# Patient Record
Sex: Male | Born: 1985
Health system: Southern US, Community
[De-identification: ages and names within clinical notes are randomized; demographics above are authoritative.]

## PROBLEM LIST (undated history)

## (undated) DIAGNOSIS — F209 Schizophrenia, unspecified: Secondary | ICD-10-CM

## (undated) DIAGNOSIS — R011 Cardiac murmur, unspecified: Secondary | ICD-10-CM

## (undated) DIAGNOSIS — F419 Anxiety disorder, unspecified: Secondary | ICD-10-CM

## (undated) DIAGNOSIS — B070 Plantar wart: Secondary | ICD-10-CM

## (undated) DIAGNOSIS — K219 Gastro-esophageal reflux disease without esophagitis: Secondary | ICD-10-CM

## (undated) HISTORY — PX: PLANTAR'S WART EXCISION: SHX2240

## (undated) HISTORY — DX: Cardiac murmur, unspecified: R01.1

## (undated) HISTORY — PX: ESOPHAGOGASTRODUODENOSCOPY: SHX1529

---
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
## 2008-12-24 ENCOUNTER — Inpatient Hospital Stay: Payer: Self-pay | Admitting: Psychiatry

## 2009-08-05 ENCOUNTER — Emergency Department: Payer: Self-pay | Admitting: Emergency Medicine

## 2009-08-10 ENCOUNTER — Emergency Department: Payer: Self-pay | Admitting: Emergency Medicine

## 2009-10-27 ENCOUNTER — Emergency Department: Payer: Self-pay | Admitting: Emergency Medicine

## 2010-06-05 ENCOUNTER — Ambulatory Visit: Payer: Self-pay | Admitting: Emergency Medicine

## 2010-09-08 ENCOUNTER — Emergency Department: Payer: Self-pay | Admitting: Emergency Medicine

## 2010-10-26 ENCOUNTER — Ambulatory Visit: Payer: Self-pay | Admitting: Gastroenterology

## 2010-10-28 ENCOUNTER — Emergency Department: Payer: Self-pay | Admitting: Emergency Medicine

## 2011-03-20 ENCOUNTER — Emergency Department: Payer: Self-pay | Admitting: Emergency Medicine

## 2011-03-21 ENCOUNTER — Ambulatory Visit: Payer: Self-pay | Admitting: Internal Medicine

## 2011-04-15 DIAGNOSIS — S8000XA Contusion of unspecified knee, initial encounter: Secondary | ICD-10-CM | POA: Diagnosis not present

## 2011-04-28 ENCOUNTER — Emergency Department: Payer: Self-pay | Admitting: Emergency Medicine

## 2011-04-28 DIAGNOSIS — F17200 Nicotine dependence, unspecified, uncomplicated: Secondary | ICD-10-CM | POA: Diagnosis not present

## 2011-04-28 DIAGNOSIS — K089 Disorder of teeth and supporting structures, unspecified: Secondary | ICD-10-CM | POA: Diagnosis not present

## 2011-04-28 DIAGNOSIS — G8918 Other acute postprocedural pain: Secondary | ICD-10-CM | POA: Diagnosis not present

## 2011-06-17 DIAGNOSIS — J019 Acute sinusitis, unspecified: Secondary | ICD-10-CM | POA: Diagnosis not present

## 2011-07-05 ENCOUNTER — Ambulatory Visit: Payer: Self-pay | Admitting: Internal Medicine

## 2011-07-05 DIAGNOSIS — IMO0002 Reserved for concepts with insufficient information to code with codable children: Secondary | ICD-10-CM | POA: Diagnosis not present

## 2011-07-05 DIAGNOSIS — S99929A Unspecified injury of unspecified foot, initial encounter: Secondary | ICD-10-CM | POA: Diagnosis not present

## 2011-07-05 DIAGNOSIS — S99919A Unspecified injury of unspecified ankle, initial encounter: Secondary | ICD-10-CM | POA: Diagnosis not present

## 2011-07-05 DIAGNOSIS — K1370 Unspecified lesions of oral mucosa: Secondary | ICD-10-CM | POA: Diagnosis not present

## 2011-07-05 DIAGNOSIS — M25569 Pain in unspecified knee: Secondary | ICD-10-CM | POA: Diagnosis not present

## 2011-07-10 DIAGNOSIS — F259 Schizoaffective disorder, unspecified: Secondary | ICD-10-CM | POA: Diagnosis not present

## 2011-07-31 DIAGNOSIS — F259 Schizoaffective disorder, unspecified: Secondary | ICD-10-CM | POA: Diagnosis not present

## 2011-08-06 ENCOUNTER — Ambulatory Visit: Payer: Self-pay | Admitting: Internal Medicine

## 2011-08-06 DIAGNOSIS — L02818 Cutaneous abscess of other sites: Secondary | ICD-10-CM | POA: Diagnosis not present

## 2011-08-06 DIAGNOSIS — L03818 Cellulitis of other sites: Secondary | ICD-10-CM | POA: Diagnosis not present

## 2011-08-13 ENCOUNTER — Emergency Department: Payer: Self-pay | Admitting: *Deleted

## 2011-08-13 DIAGNOSIS — R112 Nausea with vomiting, unspecified: Secondary | ICD-10-CM | POA: Diagnosis not present

## 2011-08-13 DIAGNOSIS — I10 Essential (primary) hypertension: Secondary | ICD-10-CM | POA: Diagnosis not present

## 2011-08-13 DIAGNOSIS — F17200 Nicotine dependence, unspecified, uncomplicated: Secondary | ICD-10-CM | POA: Diagnosis not present

## 2011-08-13 DIAGNOSIS — K219 Gastro-esophageal reflux disease without esophagitis: Secondary | ICD-10-CM | POA: Diagnosis not present

## 2011-08-13 DIAGNOSIS — I059 Rheumatic mitral valve disease, unspecified: Secondary | ICD-10-CM | POA: Diagnosis not present

## 2011-08-13 DIAGNOSIS — Z79899 Other long term (current) drug therapy: Secondary | ICD-10-CM | POA: Diagnosis not present

## 2011-08-13 DIAGNOSIS — R079 Chest pain, unspecified: Secondary | ICD-10-CM | POA: Diagnosis not present

## 2011-08-13 DIAGNOSIS — R0789 Other chest pain: Secondary | ICD-10-CM | POA: Diagnosis not present

## 2011-08-13 DIAGNOSIS — R1110 Vomiting, unspecified: Secondary | ICD-10-CM | POA: Diagnosis not present

## 2011-08-13 DIAGNOSIS — K2970 Gastritis, unspecified, without bleeding: Secondary | ICD-10-CM | POA: Diagnosis not present

## 2011-08-13 DIAGNOSIS — F319 Bipolar disorder, unspecified: Secondary | ICD-10-CM | POA: Diagnosis not present

## 2011-08-13 LAB — COMPREHENSIVE METABOLIC PANEL
Alkaline Phosphatase: 109 U/L (ref 50–136)
Anion Gap: 8 (ref 7–16)
Bilirubin,Total: 0.6 mg/dL (ref 0.2–1.0)
Calcium, Total: 9.5 mg/dL (ref 8.5–10.1)
Chloride: 103 mmol/L (ref 98–107)
Co2: 26 mmol/L (ref 21–32)
Creatinine: 1.14 mg/dL (ref 0.60–1.30)
Glucose: 68 mg/dL (ref 65–99)
Osmolality: 272 (ref 275–301)
Potassium: 4.6 mmol/L (ref 3.5–5.1)
SGPT (ALT): 34 U/L

## 2011-08-13 LAB — CBC
HCT: 50.8 % (ref 40.0–52.0)
MCH: 29.2 pg (ref 26.0–34.0)
MCHC: 32.8 g/dL (ref 32.0–36.0)
MCV: 89 fL (ref 80–100)
Platelet: 219 10*3/uL (ref 150–440)
RBC: 5.7 10*6/uL (ref 4.40–5.90)
RDW: 13.3 % (ref 11.5–14.5)
WBC: 5.3 10*3/uL (ref 3.8–10.6)

## 2011-08-13 LAB — URINALYSIS, COMPLETE
Bilirubin,UR: NEGATIVE
Glucose,UR: NEGATIVE mg/dL (ref 0–75)
Ph: 6 (ref 4.5–8.0)
RBC,UR: NONE SEEN /HPF (ref 0–5)
Squamous Epithelial: NONE SEEN

## 2011-08-13 LAB — CK TOTAL AND CKMB (NOT AT ARMC)
CK, Total: 195 U/L (ref 35–232)
CK-MB: 0.9 ng/mL (ref 0.5–3.6)

## 2011-08-14 DIAGNOSIS — I059 Rheumatic mitral valve disease, unspecified: Secondary | ICD-10-CM | POA: Diagnosis not present

## 2011-08-14 DIAGNOSIS — K277 Chronic peptic ulcer, site unspecified, without hemorrhage or perforation: Secondary | ICD-10-CM | POA: Diagnosis not present

## 2011-08-14 DIAGNOSIS — I10 Essential (primary) hypertension: Secondary | ICD-10-CM | POA: Diagnosis not present

## 2011-08-14 DIAGNOSIS — R112 Nausea with vomiting, unspecified: Secondary | ICD-10-CM | POA: Diagnosis not present

## 2011-08-16 ENCOUNTER — Ambulatory Visit: Payer: Self-pay | Admitting: Internal Medicine

## 2011-08-16 DIAGNOSIS — I41 Myocarditis in diseases classified elsewhere: Secondary | ICD-10-CM | POA: Diagnosis not present

## 2011-08-16 DIAGNOSIS — I409 Acute myocarditis, unspecified: Secondary | ICD-10-CM | POA: Diagnosis not present

## 2011-08-27 ENCOUNTER — Emergency Department: Payer: Self-pay | Admitting: Emergency Medicine

## 2011-08-27 DIAGNOSIS — Z79899 Other long term (current) drug therapy: Secondary | ICD-10-CM | POA: Diagnosis not present

## 2011-08-27 DIAGNOSIS — F411 Generalized anxiety disorder: Secondary | ICD-10-CM | POA: Diagnosis not present

## 2011-08-27 DIAGNOSIS — F17200 Nicotine dependence, unspecified, uncomplicated: Secondary | ICD-10-CM | POA: Diagnosis not present

## 2011-08-27 DIAGNOSIS — R209 Unspecified disturbances of skin sensation: Secondary | ICD-10-CM | POA: Diagnosis not present

## 2011-08-29 ENCOUNTER — Emergency Department: Payer: Self-pay | Admitting: Emergency Medicine

## 2011-08-29 DIAGNOSIS — I10 Essential (primary) hypertension: Secondary | ICD-10-CM | POA: Diagnosis present

## 2011-08-29 DIAGNOSIS — F200 Paranoid schizophrenia: Secondary | ICD-10-CM | POA: Diagnosis present

## 2011-08-29 DIAGNOSIS — Z79899 Other long term (current) drug therapy: Secondary | ICD-10-CM | POA: Diagnosis not present

## 2011-08-29 DIAGNOSIS — K219 Gastro-esophageal reflux disease without esophagitis: Secondary | ICD-10-CM | POA: Diagnosis present

## 2011-08-29 DIAGNOSIS — F209 Schizophrenia, unspecified: Secondary | ICD-10-CM | POA: Diagnosis not present

## 2011-08-29 DIAGNOSIS — F17200 Nicotine dependence, unspecified, uncomplicated: Secondary | ICD-10-CM | POA: Diagnosis not present

## 2011-08-29 DIAGNOSIS — F29 Unspecified psychosis not due to a substance or known physiological condition: Secondary | ICD-10-CM | POA: Diagnosis not present

## 2011-08-29 LAB — COMPREHENSIVE METABOLIC PANEL
Albumin: 4.1 g/dL (ref 3.4–5.0)
Anion Gap: 11 (ref 7–16)
Calcium, Total: 9.2 mg/dL (ref 8.5–10.1)
Chloride: 102 mmol/L (ref 98–107)
Creatinine: 1.12 mg/dL (ref 0.60–1.30)
EGFR (Non-African Amer.): >60
Glucose: 90 mg/dL (ref 65–99)
SGOT(AST): 58 U/L — ABNORMAL HIGH (ref 15–37)
SGPT (ALT): 37 U/L
Total Protein: 7.9 g/dL (ref 6.4–8.2)

## 2011-08-29 LAB — TSH: Thyroid Stimulating Horm: 0.53 u[IU]/mL

## 2011-08-29 LAB — URINALYSIS, COMPLETE
Bilirubin,UR: NEGATIVE
Leukocyte Esterase: NEGATIVE
Protein: 30
RBC,UR: 16 /HPF (ref 0–5)
Specific Gravity: 1.029 (ref 1.003–1.030)
WBC UR: 1 /HPF (ref 0–5)

## 2011-08-29 LAB — ETHANOL
Ethanol %: <0.003 % (ref 0.000–0.080)
Ethanol: <3 mg/dL

## 2011-08-29 LAB — CBC
HCT: 46.3 % (ref 40.0–52.0)
MCHC: 33.4 g/dL (ref 32.0–36.0)
MCV: 88 fL (ref 80–100)
RBC: 5.28 10*6/uL (ref 4.40–5.90)

## 2011-08-29 LAB — DRUG SCREEN, URINE
Benzodiazepine, Ur Scrn: NEGATIVE (ref ?–200)
Cocaine Metabolite,Ur ~~LOC~~: NEGATIVE (ref ?–300)
MDMA (Ecstasy)Ur Screen: NEGATIVE (ref ?–500)
Methadone, Ur Screen: NEGATIVE (ref ?–300)
Opiate, Ur Screen: NEGATIVE (ref ?–300)
Phencyclidine (PCP) Ur S: NEGATIVE (ref ?–25)
Tricyclic, Ur Screen: NEGATIVE (ref ?–1000)

## 2011-09-10 DIAGNOSIS — F259 Schizoaffective disorder, unspecified: Secondary | ICD-10-CM | POA: Diagnosis not present

## 2011-09-13 DIAGNOSIS — F200 Paranoid schizophrenia: Secondary | ICD-10-CM | POA: Diagnosis not present

## 2011-09-13 DIAGNOSIS — B009 Herpesviral infection, unspecified: Secondary | ICD-10-CM | POA: Diagnosis not present

## 2011-09-13 DIAGNOSIS — I10 Essential (primary) hypertension: Secondary | ICD-10-CM | POA: Diagnosis not present

## 2011-11-02 DIAGNOSIS — F259 Schizoaffective disorder, unspecified: Secondary | ICD-10-CM | POA: Diagnosis not present

## 2011-12-03 DIAGNOSIS — R1115 Cyclical vomiting syndrome unrelated to migraine: Secondary | ICD-10-CM | POA: Diagnosis not present

## 2011-12-03 DIAGNOSIS — R5381 Other malaise: Secondary | ICD-10-CM | POA: Diagnosis not present

## 2011-12-17 DIAGNOSIS — Z13220 Encounter for screening for lipoid disorders: Secondary | ICD-10-CM | POA: Diagnosis not present

## 2011-12-17 DIAGNOSIS — Z23 Encounter for immunization: Secondary | ICD-10-CM | POA: Diagnosis not present

## 2011-12-17 DIAGNOSIS — R1115 Cyclical vomiting syndrome unrelated to migraine: Secondary | ICD-10-CM | POA: Diagnosis not present

## 2011-12-17 DIAGNOSIS — R109 Unspecified abdominal pain: Secondary | ICD-10-CM | POA: Diagnosis not present

## 2011-12-25 DIAGNOSIS — F259 Schizoaffective disorder, unspecified: Secondary | ICD-10-CM | POA: Diagnosis not present

## 2012-01-08 DIAGNOSIS — F259 Schizoaffective disorder, unspecified: Secondary | ICD-10-CM | POA: Diagnosis not present

## 2012-01-15 DIAGNOSIS — F259 Schizoaffective disorder, unspecified: Secondary | ICD-10-CM | POA: Diagnosis not present

## 2012-02-03 DIAGNOSIS — J069 Acute upper respiratory infection, unspecified: Secondary | ICD-10-CM | POA: Diagnosis not present

## 2012-02-03 DIAGNOSIS — K219 Gastro-esophageal reflux disease without esophagitis: Secondary | ICD-10-CM | POA: Diagnosis not present

## 2012-02-10 DIAGNOSIS — F259 Schizoaffective disorder, unspecified: Secondary | ICD-10-CM | POA: Diagnosis not present

## 2012-02-13 ENCOUNTER — Ambulatory Visit: Payer: Self-pay | Admitting: Family Medicine

## 2012-02-13 DIAGNOSIS — J069 Acute upper respiratory infection, unspecified: Secondary | ICD-10-CM | POA: Diagnosis not present

## 2012-02-13 DIAGNOSIS — F17200 Nicotine dependence, unspecified, uncomplicated: Secondary | ICD-10-CM | POA: Diagnosis not present

## 2012-02-13 DIAGNOSIS — R05 Cough: Secondary | ICD-10-CM | POA: Diagnosis not present

## 2012-02-13 DIAGNOSIS — R079 Chest pain, unspecified: Secondary | ICD-10-CM | POA: Diagnosis not present

## 2012-03-19 ENCOUNTER — Emergency Department: Payer: Self-pay | Admitting: Emergency Medicine

## 2012-03-19 DIAGNOSIS — R1013 Epigastric pain: Secondary | ICD-10-CM | POA: Diagnosis not present

## 2012-03-19 DIAGNOSIS — R079 Chest pain, unspecified: Secondary | ICD-10-CM | POA: Diagnosis not present

## 2012-03-19 DIAGNOSIS — K219 Gastro-esophageal reflux disease without esophagitis: Secondary | ICD-10-CM | POA: Diagnosis not present

## 2012-03-19 DIAGNOSIS — R0789 Other chest pain: Secondary | ICD-10-CM | POA: Diagnosis not present

## 2012-03-19 DIAGNOSIS — Z79899 Other long term (current) drug therapy: Secondary | ICD-10-CM | POA: Diagnosis not present

## 2012-03-19 DIAGNOSIS — R071 Chest pain on breathing: Secondary | ICD-10-CM | POA: Diagnosis not present

## 2012-03-19 DIAGNOSIS — R112 Nausea with vomiting, unspecified: Secondary | ICD-10-CM | POA: Diagnosis not present

## 2012-03-19 LAB — CBC
HCT: 49.1 % (ref 40.0–52.0)
HGB: 15.8 g/dL (ref 13.0–18.0)
MCH: 28.8 pg (ref 26.0–34.0)
MCV: 90 fL (ref 80–100)
RBC: 5.48 10*6/uL (ref 4.40–5.90)
RDW: 13.4 % (ref 11.5–14.5)
WBC: 7.2 10*3/uL (ref 3.8–10.6)

## 2012-03-19 LAB — COMPREHENSIVE METABOLIC PANEL
Albumin: 3.7 g/dL (ref 3.4–5.0)
Alkaline Phosphatase: 109 U/L (ref 50–136)
Bilirubin,Total: 0.2 mg/dL (ref 0.2–1.0)
Calcium, Total: 9 mg/dL (ref 8.5–10.1)
Co2: 27 mmol/L (ref 21–32)
Creatinine: 1.08 mg/dL (ref 0.60–1.30)
EGFR (African American): >60
EGFR (Non-African Amer.): >60
Glucose: 110 mg/dL — ABNORMAL HIGH (ref 65–99)
Osmolality: 285 (ref 275–301)
Potassium: 3.7 mmol/L (ref 3.5–5.1)
SGPT (ALT): 34 U/L (ref 12–78)
Sodium: 142 mmol/L (ref 136–145)

## 2012-03-19 LAB — LIPASE, BLOOD: Lipase: 130 U/L (ref 73–393)

## 2012-03-19 LAB — TROPONIN I: Troponin-I: <0.02 ng/mL

## 2012-03-26 DIAGNOSIS — F259 Schizoaffective disorder, unspecified: Secondary | ICD-10-CM | POA: Diagnosis not present

## 2012-04-23 DIAGNOSIS — N410 Acute prostatitis: Secondary | ICD-10-CM | POA: Diagnosis not present

## 2012-04-23 DIAGNOSIS — J309 Allergic rhinitis, unspecified: Secondary | ICD-10-CM | POA: Diagnosis not present

## 2012-04-23 DIAGNOSIS — R109 Unspecified abdominal pain: Secondary | ICD-10-CM | POA: Diagnosis not present

## 2012-04-27 ENCOUNTER — Ambulatory Visit: Payer: Self-pay | Admitting: Family Medicine

## 2012-04-27 DIAGNOSIS — Q619 Cystic kidney disease, unspecified: Secondary | ICD-10-CM | POA: Diagnosis not present

## 2012-04-27 DIAGNOSIS — R1013 Epigastric pain: Secondary | ICD-10-CM | POA: Diagnosis not present

## 2012-04-27 DIAGNOSIS — R1110 Vomiting, unspecified: Secondary | ICD-10-CM | POA: Diagnosis not present

## 2012-04-29 DIAGNOSIS — J309 Allergic rhinitis, unspecified: Secondary | ICD-10-CM | POA: Diagnosis not present

## 2012-04-29 DIAGNOSIS — R1084 Generalized abdominal pain: Secondary | ICD-10-CM | POA: Diagnosis not present

## 2012-05-09 DIAGNOSIS — Z79899 Other long term (current) drug therapy: Secondary | ICD-10-CM | POA: Diagnosis not present

## 2012-05-09 DIAGNOSIS — F259 Schizoaffective disorder, unspecified: Secondary | ICD-10-CM | POA: Diagnosis not present

## 2012-06-15 DIAGNOSIS — R1110 Vomiting, unspecified: Secondary | ICD-10-CM | POA: Diagnosis not present

## 2012-06-19 ENCOUNTER — Ambulatory Visit: Payer: Self-pay | Admitting: Gastroenterology

## 2012-06-19 DIAGNOSIS — R1110 Vomiting, unspecified: Secondary | ICD-10-CM | POA: Diagnosis not present

## 2012-07-10 ENCOUNTER — Ambulatory Visit: Payer: Self-pay | Admitting: Family Medicine

## 2012-07-10 DIAGNOSIS — R1110 Vomiting, unspecified: Secondary | ICD-10-CM | POA: Diagnosis not present

## 2012-07-10 LAB — COMPREHENSIVE METABOLIC PANEL
Albumin: 3.9 g/dL (ref 3.4–5.0)
Alkaline Phosphatase: 118 U/L (ref 50–136)
Anion Gap: 4 — ABNORMAL LOW (ref 7–16)
Calcium, Total: 9.5 mg/dL (ref 8.5–10.1)
EGFR (African American): >60
EGFR (Non-African Amer.): >60
Glucose: 81 mg/dL (ref 65–99)
Osmolality: 277 (ref 275–301)
Potassium: 3.9 mmol/L (ref 3.5–5.1)
SGOT(AST): 22 U/L (ref 15–37)
SGPT (ALT): 29 U/L (ref 12–78)
Sodium: 140 mmol/L (ref 136–145)
Total Protein: 7.6 g/dL (ref 6.4–8.2)

## 2012-07-10 LAB — CBC WITH DIFFERENTIAL/PLATELET
Basophil #: 0.1 10*3/uL (ref 0.0–0.1)
Basophil %: 0.9 %
Eosinophil #: 0.1 10*3/uL (ref 0.0–0.7)
HCT: 51.1 % (ref 40.0–52.0)
Lymphocyte #: 2 10*3/uL (ref 1.0–3.6)
MCHC: 32.2 g/dL (ref 32.0–36.0)
Monocyte #: 0.5 x10 3/mm (ref 0.2–1.0)
Monocyte %: 8.5 %
Neutrophil %: 55.1 %
RBC: 5.81 10*6/uL (ref 4.40–5.90)
RDW: 13.1 % (ref 11.5–14.5)
WBC: 6.1 10*3/uL (ref 3.8–10.6)

## 2012-10-09 DIAGNOSIS — F259 Schizoaffective disorder, unspecified: Secondary | ICD-10-CM | POA: Diagnosis not present

## 2012-10-15 DIAGNOSIS — F259 Schizoaffective disorder, unspecified: Secondary | ICD-10-CM | POA: Diagnosis not present

## 2012-10-19 ENCOUNTER — Emergency Department: Payer: Self-pay | Admitting: Emergency Medicine

## 2012-10-19 DIAGNOSIS — R1013 Epigastric pain: Secondary | ICD-10-CM | POA: Diagnosis not present

## 2012-10-19 DIAGNOSIS — R109 Unspecified abdominal pain: Secondary | ICD-10-CM | POA: Diagnosis not present

## 2012-10-19 DIAGNOSIS — I10 Essential (primary) hypertension: Secondary | ICD-10-CM | POA: Diagnosis not present

## 2012-10-19 DIAGNOSIS — R1110 Vomiting, unspecified: Secondary | ICD-10-CM | POA: Diagnosis not present

## 2012-11-16 ENCOUNTER — Other Ambulatory Visit: Payer: Self-pay | Admitting: Family

## 2012-11-16 DIAGNOSIS — R1310 Dysphagia, unspecified: Secondary | ICD-10-CM | POA: Diagnosis not present

## 2012-11-16 DIAGNOSIS — R112 Nausea with vomiting, unspecified: Secondary | ICD-10-CM | POA: Diagnosis not present

## 2012-11-16 DIAGNOSIS — R1084 Generalized abdominal pain: Secondary | ICD-10-CM | POA: Diagnosis not present

## 2012-11-16 DIAGNOSIS — R634 Abnormal weight loss: Secondary | ICD-10-CM | POA: Diagnosis not present

## 2012-11-16 LAB — COMPREHENSIVE METABOLIC PANEL
Albumin: 3.7 g/dL (ref 3.4–5.0)
Alkaline Phosphatase: 117 U/L (ref 50–136)
BUN: 11 mg/dL (ref 7–18)
Calcium, Total: 9.3 mg/dL (ref 8.5–10.1)
Chloride: 105 mmol/L (ref 98–107)
Creatinine: 1.19 mg/dL (ref 0.60–1.30)
EGFR (African American): >60
Osmolality: 272 (ref 275–301)
Potassium: 4.3 mmol/L (ref 3.5–5.1)
SGPT (ALT): 24 U/L (ref 12–78)
Sodium: 137 mmol/L (ref 136–145)
Total Protein: 7.4 g/dL (ref 6.4–8.2)

## 2012-11-16 LAB — CBC WITH DIFFERENTIAL/PLATELET
Basophil %: 0.8 %
Eosinophil #: 0.5 10*3/uL (ref 0.0–0.7)
Eosinophil %: 7.1 %
HCT: 47.7 % (ref 40.0–52.0)
Lymphocyte #: 2.6 10*3/uL (ref 1.0–3.6)
Lymphocyte %: 36.4 %
MCHC: 33.7 g/dL (ref 32.0–36.0)
Monocyte #: 0.5 x10 3/mm (ref 0.2–1.0)
Monocyte %: 6.3 %
Neutrophil #: 3.6 10*3/uL (ref 1.4–6.5)
Neutrophil %: 49.4 %
Platelet: 211 10*3/uL (ref 150–440)
RBC: 5.44 10*6/uL (ref 4.40–5.90)
RDW: 13.3 % (ref 11.5–14.5)
WBC: 7.2 10*3/uL (ref 3.8–10.6)

## 2012-11-16 LAB — LIPASE, BLOOD: Lipase: 100 U/L (ref 73–393)

## 2012-11-16 LAB — AMYLASE: Amylase: 57 U/L (ref 25–115)

## 2012-11-26 DIAGNOSIS — N289 Disorder of kidney and ureter, unspecified: Secondary | ICD-10-CM | POA: Diagnosis not present

## 2012-11-26 DIAGNOSIS — R112 Nausea with vomiting, unspecified: Secondary | ICD-10-CM | POA: Diagnosis not present

## 2012-11-26 DIAGNOSIS — R109 Unspecified abdominal pain: Secondary | ICD-10-CM | POA: Diagnosis not present

## 2012-11-26 DIAGNOSIS — R52 Pain, unspecified: Secondary | ICD-10-CM | POA: Diagnosis not present

## 2012-11-26 DIAGNOSIS — R634 Abnormal weight loss: Secondary | ICD-10-CM | POA: Diagnosis not present

## 2012-12-04 DIAGNOSIS — F259 Schizoaffective disorder, unspecified: Secondary | ICD-10-CM | POA: Diagnosis not present

## 2012-12-16 ENCOUNTER — Ambulatory Visit: Payer: Self-pay | Admitting: Gastroenterology

## 2012-12-16 DIAGNOSIS — Z809 Family history of malignant neoplasm, unspecified: Secondary | ICD-10-CM | POA: Diagnosis not present

## 2012-12-16 DIAGNOSIS — Z8371 Family history of colonic polyps: Secondary | ICD-10-CM | POA: Diagnosis not present

## 2012-12-16 DIAGNOSIS — R1310 Dysphagia, unspecified: Secondary | ICD-10-CM | POA: Diagnosis not present

## 2012-12-16 DIAGNOSIS — R12 Heartburn: Secondary | ICD-10-CM | POA: Diagnosis not present

## 2012-12-16 DIAGNOSIS — Z800 Family history of malignant neoplasm of digestive organs: Secondary | ICD-10-CM | POA: Diagnosis not present

## 2012-12-16 DIAGNOSIS — Z79899 Other long term (current) drug therapy: Secondary | ICD-10-CM | POA: Diagnosis not present

## 2012-12-16 DIAGNOSIS — Z803 Family history of malignant neoplasm of breast: Secondary | ICD-10-CM | POA: Diagnosis not present

## 2012-12-16 DIAGNOSIS — J309 Allergic rhinitis, unspecified: Secondary | ICD-10-CM | POA: Diagnosis not present

## 2012-12-16 DIAGNOSIS — F319 Bipolar disorder, unspecified: Secondary | ICD-10-CM | POA: Diagnosis not present

## 2012-12-16 DIAGNOSIS — I517 Cardiomegaly: Secondary | ICD-10-CM | POA: Diagnosis not present

## 2012-12-21 LAB — PATHOLOGY REPORT

## 2013-01-04 DIAGNOSIS — Z23 Encounter for immunization: Secondary | ICD-10-CM | POA: Diagnosis not present

## 2013-01-04 DIAGNOSIS — N529 Male erectile dysfunction, unspecified: Secondary | ICD-10-CM | POA: Diagnosis not present

## 2013-02-03 DIAGNOSIS — F259 Schizoaffective disorder, unspecified: Secondary | ICD-10-CM | POA: Diagnosis not present

## 2013-02-26 DIAGNOSIS — K920 Hematemesis: Secondary | ICD-10-CM | POA: Diagnosis not present

## 2013-03-01 ENCOUNTER — Ambulatory Visit: Payer: Self-pay | Admitting: Gastroenterology

## 2013-03-01 DIAGNOSIS — F319 Bipolar disorder, unspecified: Secondary | ICD-10-CM | POA: Diagnosis not present

## 2013-03-01 DIAGNOSIS — R042 Hemoptysis: Secondary | ICD-10-CM | POA: Diagnosis not present

## 2013-03-01 DIAGNOSIS — Z803 Family history of malignant neoplasm of breast: Secondary | ICD-10-CM | POA: Diagnosis not present

## 2013-03-01 DIAGNOSIS — F17200 Nicotine dependence, unspecified, uncomplicated: Secondary | ICD-10-CM | POA: Diagnosis not present

## 2013-03-01 DIAGNOSIS — K920 Hematemesis: Secondary | ICD-10-CM | POA: Diagnosis not present

## 2013-03-01 DIAGNOSIS — J309 Allergic rhinitis, unspecified: Secondary | ICD-10-CM | POA: Diagnosis not present

## 2013-03-16 DIAGNOSIS — K915 Postcholecystectomy syndrome: Secondary | ICD-10-CM | POA: Diagnosis not present

## 2013-03-16 DIAGNOSIS — R112 Nausea with vomiting, unspecified: Secondary | ICD-10-CM | POA: Diagnosis not present

## 2013-03-16 DIAGNOSIS — R1310 Dysphagia, unspecified: Secondary | ICD-10-CM | POA: Diagnosis not present

## 2013-06-03 DIAGNOSIS — B86 Scabies: Secondary | ICD-10-CM | POA: Diagnosis not present

## 2013-06-29 DIAGNOSIS — F410 Panic disorder [episodic paroxysmal anxiety] without agoraphobia: Secondary | ICD-10-CM | POA: Diagnosis not present

## 2013-06-29 DIAGNOSIS — G4700 Insomnia, unspecified: Secondary | ICD-10-CM | POA: Diagnosis not present

## 2013-07-08 DIAGNOSIS — N529 Male erectile dysfunction, unspecified: Secondary | ICD-10-CM | POA: Diagnosis not present

## 2013-07-08 DIAGNOSIS — K219 Gastro-esophageal reflux disease without esophagitis: Secondary | ICD-10-CM | POA: Diagnosis not present

## 2013-07-09 DIAGNOSIS — R5383 Other fatigue: Secondary | ICD-10-CM | POA: Diagnosis not present

## 2013-07-09 DIAGNOSIS — N529 Male erectile dysfunction, unspecified: Secondary | ICD-10-CM | POA: Diagnosis not present

## 2013-07-09 DIAGNOSIS — R5381 Other malaise: Secondary | ICD-10-CM | POA: Diagnosis not present

## 2013-07-09 DIAGNOSIS — K219 Gastro-esophageal reflux disease without esophagitis: Secondary | ICD-10-CM | POA: Diagnosis not present

## 2013-07-09 DIAGNOSIS — Z125 Encounter for screening for malignant neoplasm of prostate: Secondary | ICD-10-CM | POA: Diagnosis not present

## 2013-07-25 ENCOUNTER — Ambulatory Visit: Payer: Self-pay | Admitting: Family Medicine

## 2013-07-25 DIAGNOSIS — S298XXA Other specified injuries of thorax, initial encounter: Secondary | ICD-10-CM | POA: Diagnosis not present

## 2013-07-25 DIAGNOSIS — S0003XA Contusion of scalp, initial encounter: Secondary | ICD-10-CM | POA: Diagnosis not present

## 2013-07-25 DIAGNOSIS — F17200 Nicotine dependence, unspecified, uncomplicated: Secondary | ICD-10-CM | POA: Diagnosis not present

## 2013-07-25 DIAGNOSIS — S3981XA Other specified injuries of abdomen, initial encounter: Secondary | ICD-10-CM | POA: Diagnosis not present

## 2013-07-25 DIAGNOSIS — I428 Other cardiomyopathies: Secondary | ICD-10-CM | POA: Diagnosis not present

## 2013-07-25 DIAGNOSIS — S0990XA Unspecified injury of head, initial encounter: Secondary | ICD-10-CM | POA: Diagnosis not present

## 2013-07-25 DIAGNOSIS — F410 Panic disorder [episodic paroxysmal anxiety] without agoraphobia: Secondary | ICD-10-CM | POA: Diagnosis not present

## 2013-07-25 DIAGNOSIS — I10 Essential (primary) hypertension: Secondary | ICD-10-CM | POA: Diagnosis not present

## 2013-07-25 DIAGNOSIS — K219 Gastro-esophageal reflux disease without esophagitis: Secondary | ICD-10-CM | POA: Diagnosis not present

## 2013-07-25 DIAGNOSIS — S1093XA Contusion of unspecified part of neck, initial encounter: Secondary | ICD-10-CM | POA: Diagnosis not present

## 2013-07-25 DIAGNOSIS — R51 Headache: Secondary | ICD-10-CM | POA: Diagnosis not present

## 2013-07-25 DIAGNOSIS — Z79899 Other long term (current) drug therapy: Secondary | ICD-10-CM | POA: Diagnosis not present

## 2013-07-25 DIAGNOSIS — R079 Chest pain, unspecified: Secondary | ICD-10-CM | POA: Diagnosis not present

## 2013-08-25 ENCOUNTER — Emergency Department: Payer: Self-pay | Admitting: Emergency Medicine

## 2013-08-25 DIAGNOSIS — I10 Essential (primary) hypertension: Secondary | ICD-10-CM | POA: Diagnosis not present

## 2013-08-25 DIAGNOSIS — K645 Perianal venous thrombosis: Secondary | ICD-10-CM | POA: Diagnosis not present

## 2013-08-25 DIAGNOSIS — F17200 Nicotine dependence, unspecified, uncomplicated: Secondary | ICD-10-CM | POA: Diagnosis not present

## 2013-08-25 LAB — CBC WITH DIFFERENTIAL/PLATELET
BASOS PCT: 0.7 %
Basophil #: 0.1 10*3/uL (ref 0.0–0.1)
EOS PCT: 3.8 %
Eosinophil #: 0.3 10*3/uL (ref 0.0–0.7)
HCT: 49 % (ref 40.0–52.0)
HGB: 15.9 g/dL (ref 13.0–18.0)
Lymphocyte #: 2.9 10*3/uL (ref 1.0–3.6)
Lymphocyte %: 40 %
MCH: 29.4 pg (ref 26.0–34.0)
MCHC: 32.4 g/dL (ref 32.0–36.0)
MCV: 91 fL (ref 80–100)
MONO ABS: 0.6 x10 3/mm (ref 0.2–1.0)
Monocyte %: 8 %
NEUTROS ABS: 3.5 10*3/uL (ref 1.4–6.5)
NEUTROS PCT: 47.5 %
Platelet: 177 10*3/uL (ref 150–440)
RBC: 5.39 10*6/uL (ref 4.40–5.90)
RDW: 13.6 % (ref 11.5–14.5)
WBC: 7.4 10*3/uL (ref 3.8–10.6)

## 2013-08-25 LAB — COMPREHENSIVE METABOLIC PANEL
ALBUMIN: 3.6 g/dL (ref 3.4–5.0)
Alkaline Phosphatase: 93 U/L
Anion Gap: 5 — ABNORMAL LOW (ref 7–16)
BUN: 10 mg/dL (ref 7–18)
Bilirubin,Total: 0.2 mg/dL (ref 0.2–1.0)
CHLORIDE: 108 mmol/L — AB (ref 98–107)
CO2: 27 mmol/L (ref 21–32)
CREATININE: 1.08 mg/dL (ref 0.60–1.30)
Calcium, Total: 9 mg/dL (ref 8.5–10.1)
EGFR (African American): >60
EGFR (Non-African Amer.): >60
GLUCOSE: 79 mg/dL (ref 65–99)
OSMOLALITY: 277 (ref 275–301)
Potassium: 4 mmol/L (ref 3.5–5.1)
SGOT(AST): 24 U/L (ref 15–37)
SGPT (ALT): 22 U/L (ref 12–78)
SODIUM: 140 mmol/L (ref 136–145)
Total Protein: 7.1 g/dL (ref 6.4–8.2)

## 2013-08-25 LAB — URINALYSIS, COMPLETE
BACTERIA: NONE SEEN
BILIRUBIN, UR: NEGATIVE
Blood: NEGATIVE
Glucose,UR: NEGATIVE mg/dL (ref 0–75)
Ketone: NEGATIVE
LEUKOCYTE ESTERASE: NEGATIVE
Nitrite: NEGATIVE
PROTEIN: NEGATIVE
Ph: 6 (ref 4.5–8.0)
RBC,UR: NONE SEEN /HPF (ref 0–5)
Specific Gravity: 1.02 (ref 1.003–1.030)
Squamous Epithelial: NONE SEEN
WBC UR: <1 /HPF (ref 0–5)

## 2013-08-25 LAB — LIPASE, BLOOD: LIPASE: 125 U/L (ref 73–393)

## 2013-08-27 DIAGNOSIS — K649 Unspecified hemorrhoids: Secondary | ICD-10-CM | POA: Diagnosis not present

## 2013-09-09 ENCOUNTER — Ambulatory Visit: Payer: Self-pay

## 2013-09-09 DIAGNOSIS — I428 Other cardiomyopathies: Secondary | ICD-10-CM | POA: Diagnosis not present

## 2013-09-09 DIAGNOSIS — K219 Gastro-esophageal reflux disease without esophagitis: Secondary | ICD-10-CM | POA: Diagnosis not present

## 2013-09-09 DIAGNOSIS — F209 Schizophrenia, unspecified: Secondary | ICD-10-CM | POA: Diagnosis not present

## 2013-09-09 DIAGNOSIS — K6289 Other specified diseases of anus and rectum: Secondary | ICD-10-CM | POA: Diagnosis not present

## 2013-09-09 DIAGNOSIS — F17200 Nicotine dependence, unspecified, uncomplicated: Secondary | ICD-10-CM | POA: Diagnosis not present

## 2013-09-09 DIAGNOSIS — I10 Essential (primary) hypertension: Secondary | ICD-10-CM | POA: Diagnosis not present

## 2013-09-09 DIAGNOSIS — Z79899 Other long term (current) drug therapy: Secondary | ICD-10-CM | POA: Diagnosis not present

## 2013-09-09 DIAGNOSIS — K594 Anal spasm: Secondary | ICD-10-CM | POA: Diagnosis not present

## 2013-09-09 LAB — OCCULT BLOOD X 1 CARD TO LAB, STOOL: OCCULT BLOOD, FECES: NEGATIVE

## 2013-10-06 ENCOUNTER — Emergency Department: Payer: Self-pay | Admitting: Emergency Medicine

## 2013-10-06 DIAGNOSIS — R1012 Left upper quadrant pain: Secondary | ICD-10-CM | POA: Diagnosis not present

## 2013-10-06 DIAGNOSIS — S0993XA Unspecified injury of face, initial encounter: Secondary | ICD-10-CM | POA: Diagnosis not present

## 2013-10-06 DIAGNOSIS — R51 Headache: Secondary | ICD-10-CM | POA: Diagnosis not present

## 2013-10-06 DIAGNOSIS — S199XXA Unspecified injury of neck, initial encounter: Secondary | ICD-10-CM | POA: Diagnosis not present

## 2013-10-06 DIAGNOSIS — S0990XA Unspecified injury of head, initial encounter: Secondary | ICD-10-CM | POA: Diagnosis not present

## 2013-10-06 DIAGNOSIS — IMO0001 Reserved for inherently not codable concepts without codable children: Secondary | ICD-10-CM | POA: Diagnosis not present

## 2013-10-06 DIAGNOSIS — R079 Chest pain, unspecified: Secondary | ICD-10-CM | POA: Diagnosis not present

## 2013-10-06 DIAGNOSIS — M542 Cervicalgia: Secondary | ICD-10-CM | POA: Diagnosis not present

## 2013-10-06 DIAGNOSIS — IMO0002 Reserved for concepts with insufficient information to code with codable children: Secondary | ICD-10-CM | POA: Diagnosis not present

## 2013-10-06 DIAGNOSIS — S3981XA Other specified injuries of abdomen, initial encounter: Secondary | ICD-10-CM | POA: Diagnosis not present

## 2013-10-06 DIAGNOSIS — T1490XA Injury, unspecified, initial encounter: Secondary | ICD-10-CM | POA: Diagnosis not present

## 2013-10-06 DIAGNOSIS — S298XXA Other specified injuries of thorax, initial encounter: Secondary | ICD-10-CM | POA: Diagnosis not present

## 2013-10-07 DIAGNOSIS — R079 Chest pain, unspecified: Secondary | ICD-10-CM | POA: Diagnosis not present

## 2013-10-07 DIAGNOSIS — S3981XA Other specified injuries of abdomen, initial encounter: Secondary | ICD-10-CM | POA: Diagnosis not present

## 2013-10-07 DIAGNOSIS — S298XXA Other specified injuries of thorax, initial encounter: Secondary | ICD-10-CM | POA: Diagnosis not present

## 2013-10-07 DIAGNOSIS — R1012 Left upper quadrant pain: Secondary | ICD-10-CM | POA: Diagnosis not present

## 2013-10-07 DIAGNOSIS — S0993XA Unspecified injury of face, initial encounter: Secondary | ICD-10-CM | POA: Diagnosis not present

## 2013-10-07 DIAGNOSIS — S199XXA Unspecified injury of neck, initial encounter: Secondary | ICD-10-CM | POA: Diagnosis not present

## 2013-10-07 DIAGNOSIS — S0990XA Unspecified injury of head, initial encounter: Secondary | ICD-10-CM | POA: Diagnosis not present

## 2013-10-07 LAB — CBC WITH DIFFERENTIAL/PLATELET
BASOS PCT: 1 %
Basophil #: 0.1 10*3/uL (ref 0.0–0.1)
EOS ABS: 0.2 10*3/uL (ref 0.0–0.7)
Eosinophil %: 2.5 %
HCT: 49.8 % (ref 40.0–52.0)
HGB: 15.7 g/dL (ref 13.0–18.0)
LYMPHS ABS: 2.3 10*3/uL (ref 1.0–3.6)
Lymphocyte %: 37.2 %
MCH: 28.8 pg (ref 26.0–34.0)
MCHC: 31.5 g/dL — ABNORMAL LOW (ref 32.0–36.0)
MCV: 91 fL (ref 80–100)
MONO ABS: 0.5 x10 3/mm (ref 0.2–1.0)
MONOS PCT: 8 %
NEUTROS PCT: 51.3 %
Neutrophil #: 3.2 10*3/uL (ref 1.4–6.5)
Platelet: 192 10*3/uL (ref 150–440)
RBC: 5.45 10*6/uL (ref 4.40–5.90)
RDW: 13.5 % (ref 11.5–14.5)
WBC: 6.2 10*3/uL (ref 3.8–10.6)

## 2013-10-07 LAB — COMPREHENSIVE METABOLIC PANEL
ALK PHOS: 94 U/L
AST: 31 U/L (ref 15–37)
Albumin: 3.6 g/dL (ref 3.4–5.0)
Anion Gap: 7 (ref 7–16)
BUN: 9 mg/dL (ref 7–18)
Bilirubin,Total: 0.5 mg/dL (ref 0.2–1.0)
CALCIUM: 8.7 mg/dL (ref 8.5–10.1)
CREATININE: 1.15 mg/dL (ref 0.60–1.30)
Chloride: 108 mmol/L — ABNORMAL HIGH (ref 98–107)
Co2: 25 mmol/L (ref 21–32)
EGFR (African American): >60
Glucose: 91 mg/dL (ref 65–99)
OSMOLALITY: 278 (ref 275–301)
POTASSIUM: 3.7 mmol/L (ref 3.5–5.1)
SGPT (ALT): 24 U/L (ref 12–78)
Sodium: 140 mmol/L (ref 136–145)
TOTAL PROTEIN: 7.4 g/dL (ref 6.4–8.2)

## 2013-10-08 ENCOUNTER — Emergency Department: Payer: Self-pay | Admitting: Emergency Medicine

## 2013-10-08 DIAGNOSIS — S139XXA Sprain of joints and ligaments of unspecified parts of neck, initial encounter: Secondary | ICD-10-CM | POA: Diagnosis not present

## 2013-10-08 DIAGNOSIS — I10 Essential (primary) hypertension: Secondary | ICD-10-CM | POA: Diagnosis not present

## 2013-10-10 ENCOUNTER — Emergency Department: Payer: Self-pay | Admitting: Internal Medicine

## 2013-10-10 DIAGNOSIS — M542 Cervicalgia: Secondary | ICD-10-CM | POA: Diagnosis not present

## 2013-10-10 DIAGNOSIS — S93409A Sprain of unspecified ligament of unspecified ankle, initial encounter: Secondary | ICD-10-CM | POA: Diagnosis not present

## 2013-10-10 DIAGNOSIS — M545 Low back pain, unspecified: Secondary | ICD-10-CM | POA: Diagnosis not present

## 2013-10-10 DIAGNOSIS — S139XXA Sprain of joints and ligaments of unspecified parts of neck, initial encounter: Secondary | ICD-10-CM | POA: Diagnosis not present

## 2013-10-10 DIAGNOSIS — S8990XA Unspecified injury of unspecified lower leg, initial encounter: Secondary | ICD-10-CM | POA: Diagnosis not present

## 2013-10-10 DIAGNOSIS — S339XXA Sprain of unspecified parts of lumbar spine and pelvis, initial encounter: Secondary | ICD-10-CM | POA: Diagnosis not present

## 2013-10-10 DIAGNOSIS — M25579 Pain in unspecified ankle and joints of unspecified foot: Secondary | ICD-10-CM | POA: Diagnosis not present

## 2013-10-10 DIAGNOSIS — M549 Dorsalgia, unspecified: Secondary | ICD-10-CM | POA: Diagnosis not present

## 2013-10-10 DIAGNOSIS — S0993XA Unspecified injury of face, initial encounter: Secondary | ICD-10-CM | POA: Diagnosis not present

## 2013-10-10 DIAGNOSIS — S99919A Unspecified injury of unspecified ankle, initial encounter: Secondary | ICD-10-CM | POA: Diagnosis not present

## 2013-10-10 DIAGNOSIS — T1490XA Injury, unspecified, initial encounter: Secondary | ICD-10-CM | POA: Diagnosis not present

## 2013-10-10 DIAGNOSIS — IMO0002 Reserved for concepts with insufficient information to code with codable children: Secondary | ICD-10-CM | POA: Diagnosis not present

## 2013-10-13 DIAGNOSIS — S139XXA Sprain of joints and ligaments of unspecified parts of neck, initial encounter: Secondary | ICD-10-CM | POA: Diagnosis not present

## 2013-10-13 DIAGNOSIS — S335XXA Sprain of ligaments of lumbar spine, initial encounter: Secondary | ICD-10-CM | POA: Diagnosis not present

## 2013-10-13 DIAGNOSIS — T4271XA Poisoning by unspecified antiepileptic and sedative-hypnotic drugs, accidental (unintentional), initial encounter: Secondary | ICD-10-CM | POA: Diagnosis not present

## 2013-10-16 IMAGING — CR DG CHEST 2V
1 series · 2 of 2 positions shown · non-contrast
Comparison: none

REASON FOR EXAM: chest pain
COMMENTS:   May transport without cardiac monitor

[Series 1: w chest pa · 0.14mm/px · 2 of 2 slices shown]
[im 1/2]
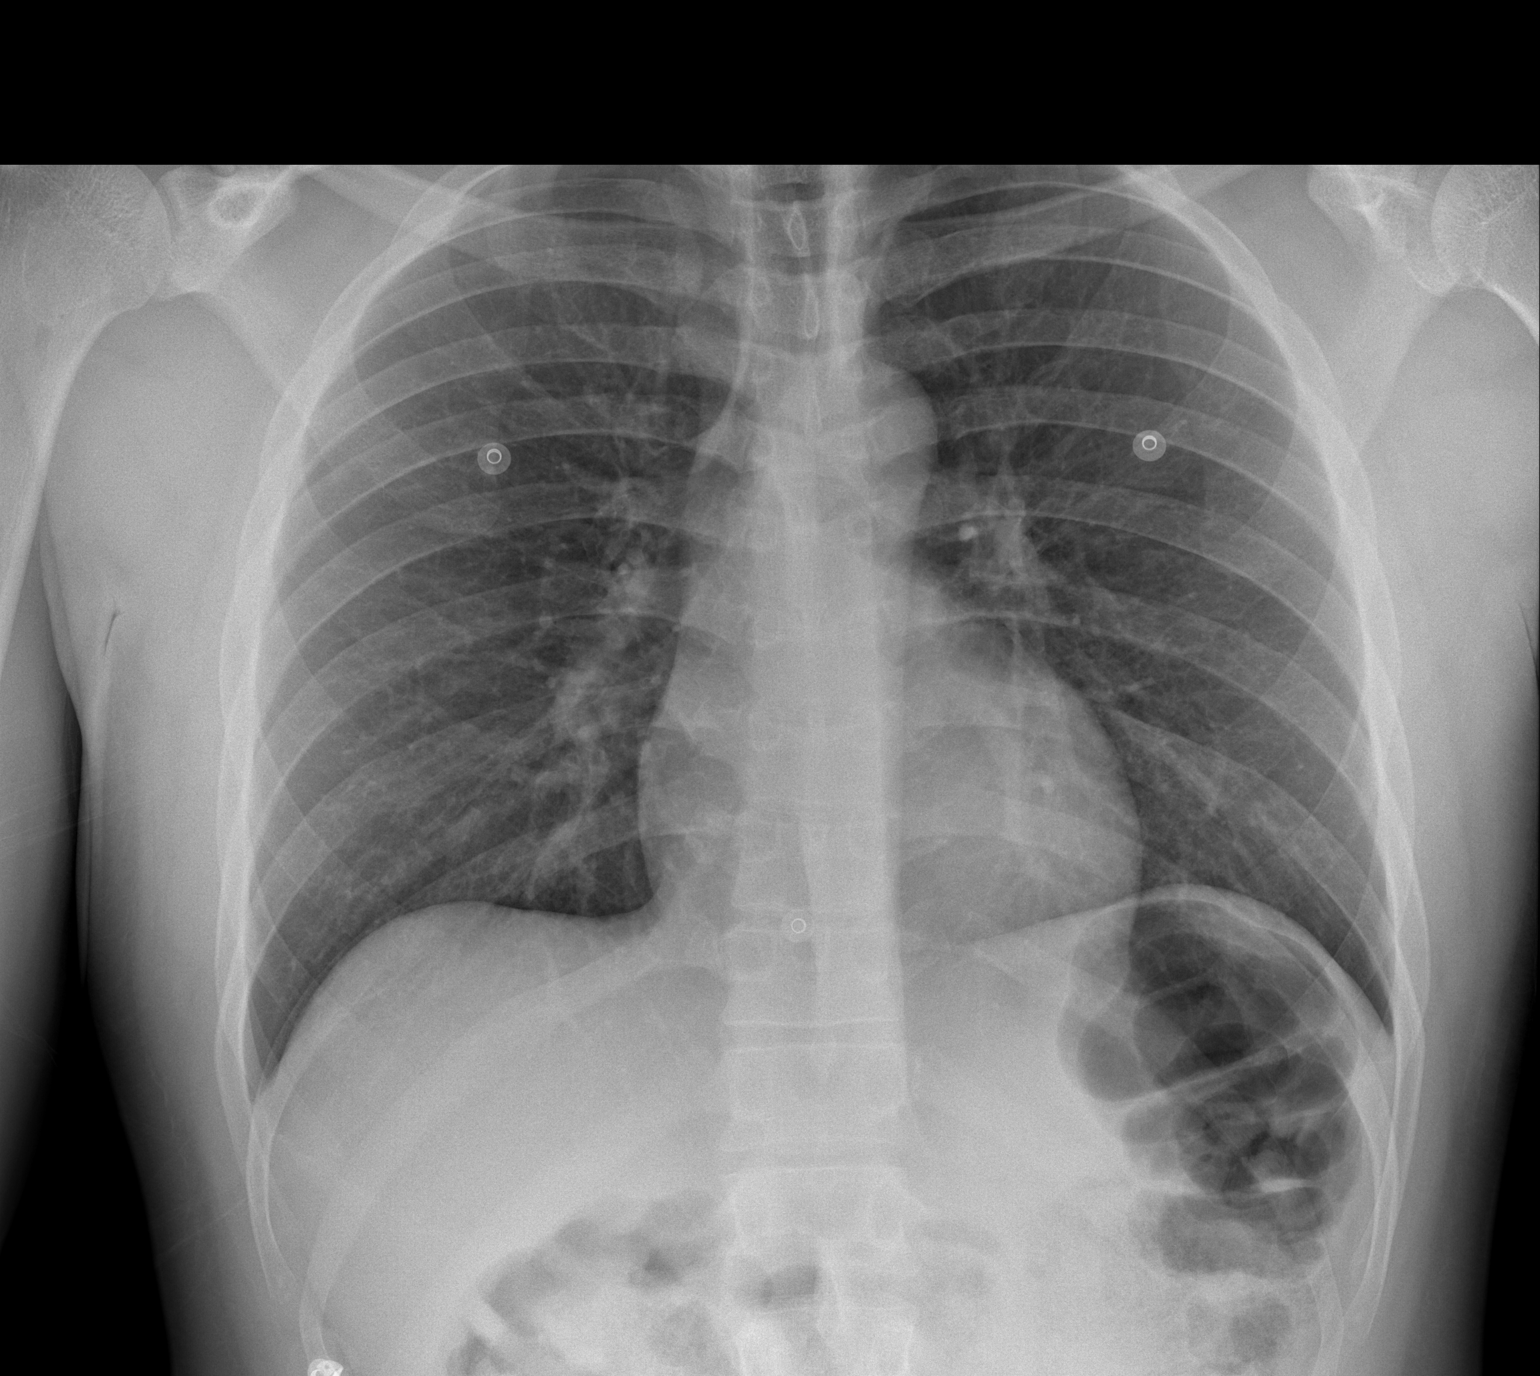
[im 2/2]
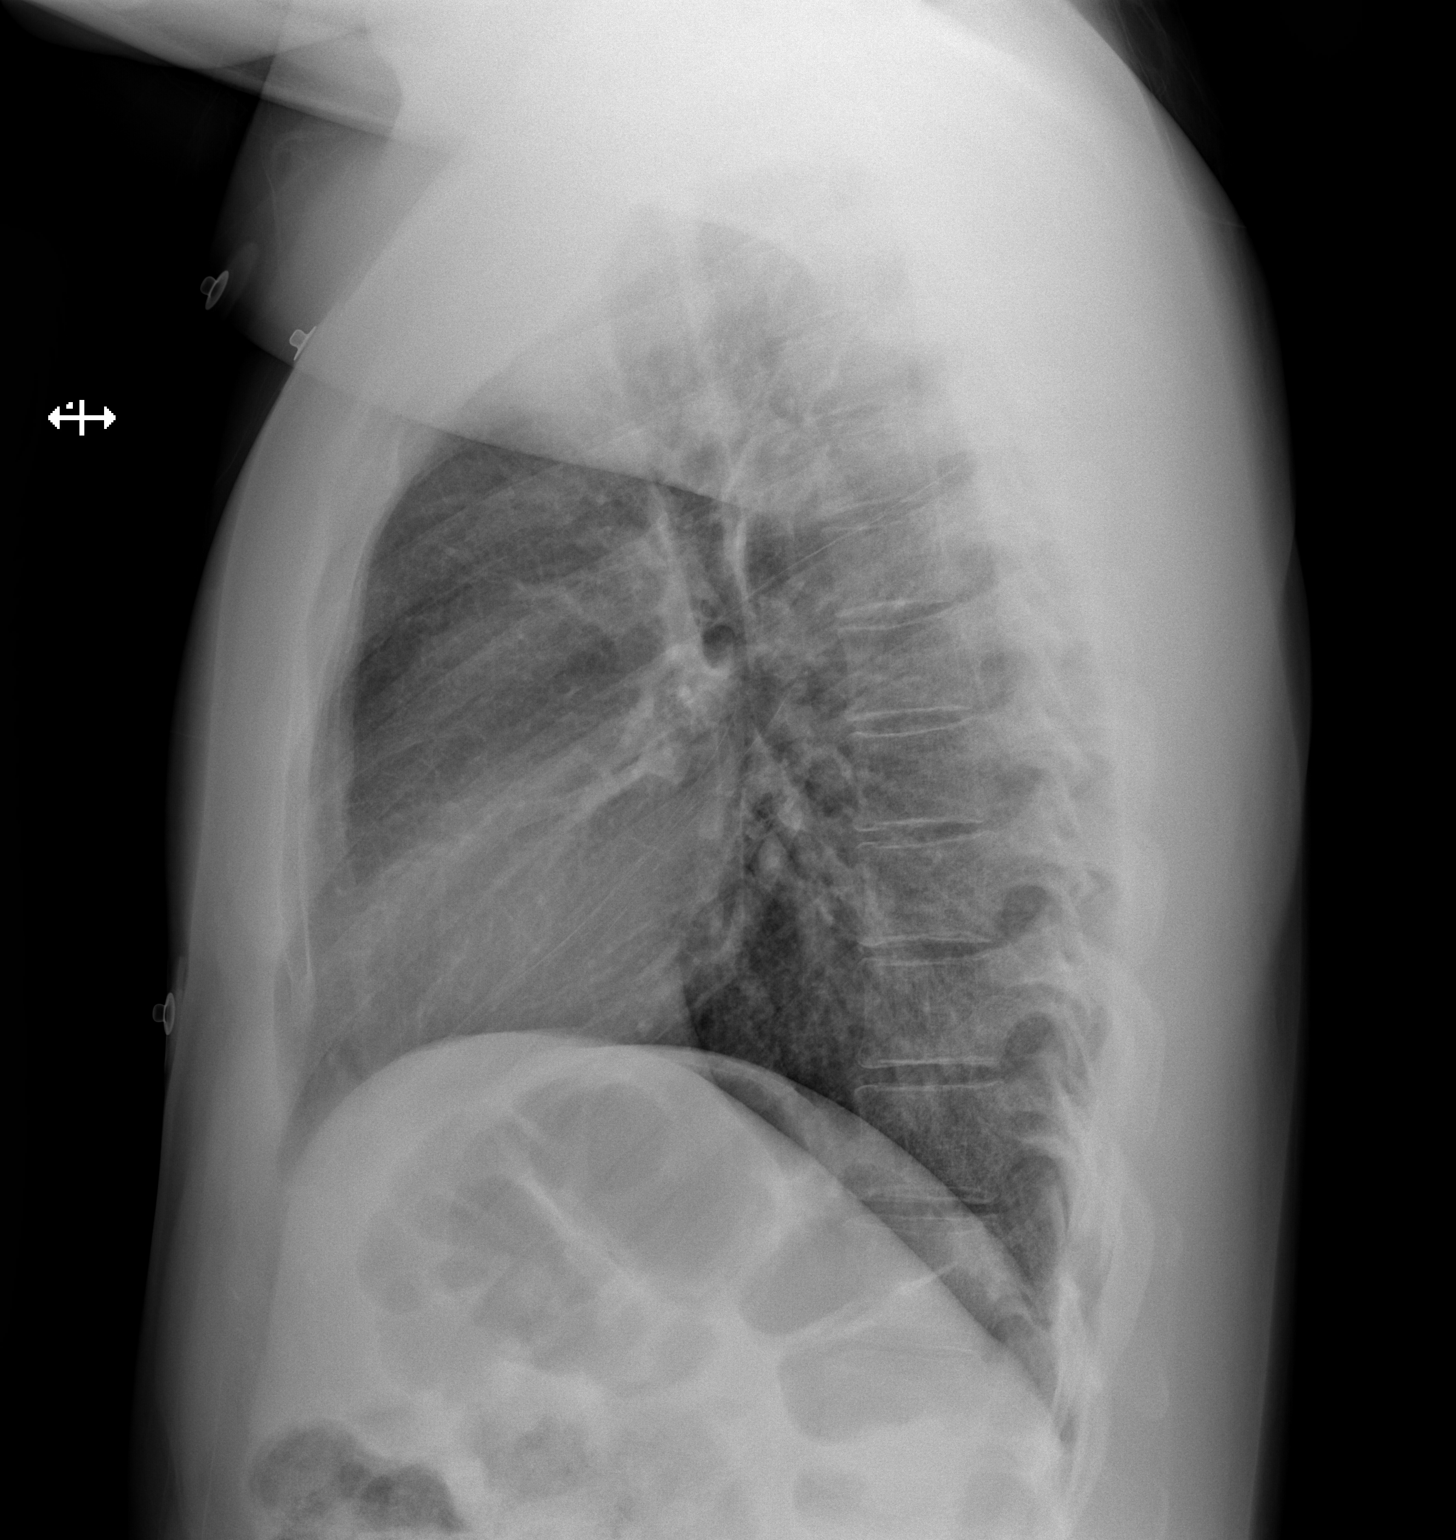

[2 of 2 positions shown; findings below may reference images not displayed]

PROCEDURE:     DXR - DXR CHEST PA (OR AP) AND LATERAL  - August 13, 2011  [DATE]

RESULT:     Comparison is made to the study 08 September, 2010.

The lungs are clear. The heart and pulmonary vessels are normal. The bony
and mediastinal structures are unremarkable. There is no effusion. There is
no pneumothorax or evidence of congestive failure.
IMPRESSION: No acute cardiopulmonary disease. Stable appearance.

[REDACTED]

## 2013-10-27 DIAGNOSIS — M545 Low back pain, unspecified: Secondary | ICD-10-CM | POA: Diagnosis not present

## 2013-10-29 DIAGNOSIS — F3112 Bipolar disorder, current episode manic without psychotic features, moderate: Secondary | ICD-10-CM | POA: Diagnosis not present

## 2013-11-08 DIAGNOSIS — M5126 Other intervertebral disc displacement, lumbar region: Secondary | ICD-10-CM | POA: Diagnosis not present

## 2013-11-08 DIAGNOSIS — M545 Low back pain, unspecified: Secondary | ICD-10-CM | POA: Diagnosis not present

## 2013-12-01 DIAGNOSIS — M549 Dorsalgia, unspecified: Secondary | ICD-10-CM | POA: Diagnosis not present

## 2013-12-01 DIAGNOSIS — M5430 Sciatica, unspecified side: Secondary | ICD-10-CM | POA: Diagnosis not present

## 2013-12-01 DIAGNOSIS — M545 Low back pain, unspecified: Secondary | ICD-10-CM | POA: Diagnosis not present

## 2013-12-01 DIAGNOSIS — F17200 Nicotine dependence, unspecified, uncomplicated: Secondary | ICD-10-CM | POA: Diagnosis not present

## 2013-12-07 DIAGNOSIS — F410 Panic disorder [episodic paroxysmal anxiety] without agoraphobia: Secondary | ICD-10-CM | POA: Diagnosis not present

## 2013-12-13 DIAGNOSIS — M549 Dorsalgia, unspecified: Secondary | ICD-10-CM | POA: Diagnosis not present

## 2013-12-28 DIAGNOSIS — R05 Cough: Secondary | ICD-10-CM | POA: Diagnosis not present

## 2013-12-28 DIAGNOSIS — J019 Acute sinusitis, unspecified: Secondary | ICD-10-CM | POA: Diagnosis not present

## 2014-01-13 DIAGNOSIS — S8991XA Unspecified injury of right lower leg, initial encounter: Secondary | ICD-10-CM | POA: Diagnosis not present

## 2014-01-13 DIAGNOSIS — S8001XA Contusion of right knee, initial encounter: Secondary | ICD-10-CM | POA: Diagnosis not present

## 2014-02-14 ENCOUNTER — Emergency Department: Payer: Self-pay | Admitting: Emergency Medicine

## 2014-02-14 DIAGNOSIS — S3992XA Unspecified injury of lower back, initial encounter: Secondary | ICD-10-CM | POA: Diagnosis not present

## 2014-02-14 DIAGNOSIS — S39012A Strain of muscle, fascia and tendon of lower back, initial encounter: Secondary | ICD-10-CM | POA: Diagnosis not present

## 2014-02-14 DIAGNOSIS — M545 Low back pain: Secondary | ICD-10-CM | POA: Diagnosis not present

## 2014-02-14 DIAGNOSIS — Z720 Tobacco use: Secondary | ICD-10-CM | POA: Diagnosis not present

## 2014-02-14 DIAGNOSIS — I10 Essential (primary) hypertension: Secondary | ICD-10-CM | POA: Diagnosis not present

## 2014-03-23 DIAGNOSIS — F410 Panic disorder [episodic paroxysmal anxiety] without agoraphobia: Secondary | ICD-10-CM | POA: Diagnosis not present

## 2014-03-23 DIAGNOSIS — F331 Major depressive disorder, recurrent, moderate: Secondary | ICD-10-CM | POA: Diagnosis not present

## 2014-03-23 DIAGNOSIS — Z79899 Other long term (current) drug therapy: Secondary | ICD-10-CM | POA: Diagnosis not present

## 2014-03-30 DIAGNOSIS — M545 Low back pain: Secondary | ICD-10-CM | POA: Diagnosis not present

## 2014-04-12 DIAGNOSIS — F331 Major depressive disorder, recurrent, moderate: Secondary | ICD-10-CM | POA: Diagnosis not present

## 2014-04-12 DIAGNOSIS — F410 Panic disorder [episodic paroxysmal anxiety] without agoraphobia: Secondary | ICD-10-CM | POA: Diagnosis not present

## 2014-05-02 ENCOUNTER — Encounter: Payer: Self-pay | Admitting: Family Medicine

## 2014-05-11 DIAGNOSIS — F410 Panic disorder [episodic paroxysmal anxiety] without agoraphobia: Secondary | ICD-10-CM | POA: Diagnosis not present

## 2014-05-11 DIAGNOSIS — F331 Major depressive disorder, recurrent, moderate: Secondary | ICD-10-CM | POA: Diagnosis not present

## 2014-07-06 ENCOUNTER — Emergency Department
Admit: 2014-07-06 | Disposition: A | Discharge status: Home / Self Care | Payer: Self-pay | Admitting: Emergency Medicine

## 2014-07-06 DIAGNOSIS — N390 Urinary tract infection, site not specified: Secondary | ICD-10-CM | POA: Diagnosis not present

## 2014-07-06 DIAGNOSIS — Z720 Tobacco use: Secondary | ICD-10-CM | POA: Diagnosis not present

## 2014-07-06 DIAGNOSIS — I10 Essential (primary) hypertension: Secondary | ICD-10-CM | POA: Diagnosis not present

## 2014-07-06 DIAGNOSIS — K2900 Acute gastritis without bleeding: Secondary | ICD-10-CM | POA: Diagnosis not present

## 2014-07-06 DIAGNOSIS — R1013 Epigastric pain: Secondary | ICD-10-CM | POA: Diagnosis not present

## 2014-07-06 DIAGNOSIS — G8929 Other chronic pain: Secondary | ICD-10-CM | POA: Diagnosis not present

## 2014-07-06 DIAGNOSIS — N281 Cyst of kidney, acquired: Secondary | ICD-10-CM | POA: Diagnosis not present

## 2014-07-06 LAB — COMPREHENSIVE METABOLIC PANEL
ALBUMIN: 4.1 g/dL
AST: 21 U/L
Alkaline Phosphatase: 84 U/L
Anion Gap: 8 (ref 7–16)
BILIRUBIN TOTAL: 0.6 mg/dL
BUN: 10 mg/dL
CHLORIDE: 103 mmol/L
CREATININE: 1.01 mg/dL
Calcium, Total: 9.7 mg/dL
Co2: 27 mmol/L
EGFR (Non-African Amer.): >60
GLUCOSE: 111 mg/dL — AB
Potassium: 3.5 mmol/L
SGPT (ALT): 16 U/L — ABNORMAL LOW
Sodium: 138 mmol/L
TOTAL PROTEIN: 7.8 g/dL

## 2014-07-06 LAB — CBC WITH DIFFERENTIAL/PLATELET
BASOS PCT: 0.6 %
Basophil #: 0 10*3/uL (ref 0.0–0.1)
Eosinophil #: 0 10*3/uL (ref 0.0–0.7)
Eosinophil %: 0.2 %
HCT: 48.5 % (ref 40.0–52.0)
HGB: 15.9 g/dL (ref 13.0–18.0)
Lymphocyte #: 1.6 10*3/uL (ref 1.0–3.6)
Lymphocyte %: 20.3 %
MCH: 29.1 pg (ref 26.0–34.0)
MCHC: 32.8 g/dL (ref 32.0–36.0)
MCV: 89 fL (ref 80–100)
MONO ABS: 0.6 x10 3/mm (ref 0.2–1.0)
Monocyte %: 8.2 %
NEUTROS ABS: 5.4 10*3/uL (ref 1.4–6.5)
Neutrophil %: 70.7 %
Platelet: 196 10*3/uL (ref 150–440)
RBC: 5.48 10*6/uL (ref 4.40–5.90)
RDW: 13.3 % (ref 11.5–14.5)
WBC: 7.7 10*3/uL (ref 3.8–10.6)

## 2014-07-06 LAB — URINALYSIS, COMPLETE
BILIRUBIN, UR: NEGATIVE
Blood: NEGATIVE
Glucose,UR: NEGATIVE mg/dL (ref 0–75)
Nitrite: NEGATIVE
PH: 5 (ref 4.5–8.0)
Specific Gravity: 1.032 (ref 1.003–1.030)

## 2014-07-06 LAB — GC/CHLAMYDIA PROBE AMP

## 2014-07-07 DIAGNOSIS — F331 Major depressive disorder, recurrent, moderate: Secondary | ICD-10-CM | POA: Diagnosis not present

## 2014-07-07 DIAGNOSIS — F410 Panic disorder [episodic paroxysmal anxiety] without agoraphobia: Secondary | ICD-10-CM | POA: Diagnosis not present

## 2014-07-08 LAB — URINE CULTURE

## 2014-08-07 ENCOUNTER — Emergency Department
Admission: EM | Admit: 2014-08-07 | Discharge: 2014-08-08 | Disposition: A | Discharge status: Home / Self Care | Payer: Self-pay

## 2014-08-08 DIAGNOSIS — S63501A Unspecified sprain of right wrist, initial encounter: Secondary | ICD-10-CM | POA: Diagnosis not present

## 2014-08-10 DIAGNOSIS — J029 Acute pharyngitis, unspecified: Secondary | ICD-10-CM | POA: Diagnosis not present

## 2014-08-23 IMAGING — RF DG UGI W/ SMALL BOWEL
13 series · 13 of 13 positions shown · non-contrast
Comparison: none

REASON FOR EXAM: Vomiting every day
COMMENTS:

PROCEDURE:     FL  - FL UPPER GI WITH SMALL BOWEL  - June 19, 2012  [DATE]
RESULT:     History: Vomiting.

[Series 2: fluoro_barium 2fps_bw · 0.17mm/px · 1 of 1 slices shown (1 of 10)]
[im 1/1]
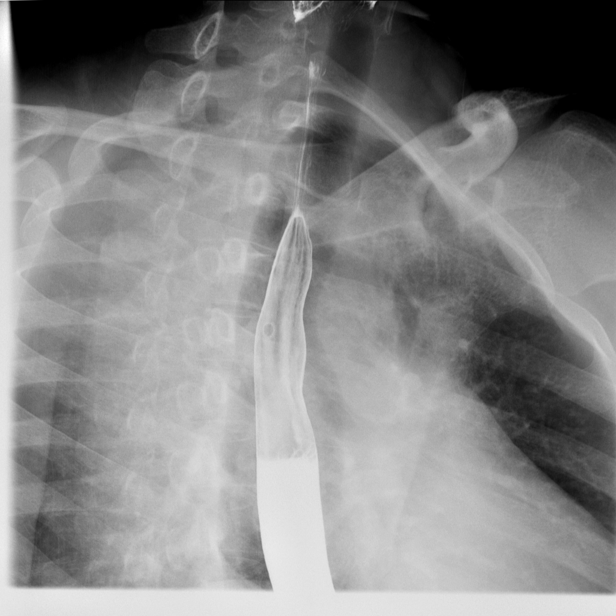

[Series 3: fluoro_barium 2fps_bw · 0.17mm/px · 1 of 1 slices shown (2 of 10)]
[im 1/1]
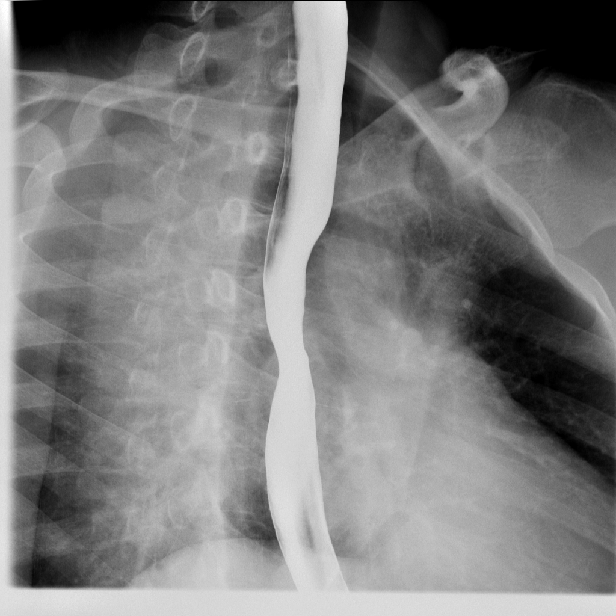

[Series 4: fluoro_barium 2fps_bw · 0.17mm/px · 1 of 1 slices shown (3 of 10)]
[im 1/1]
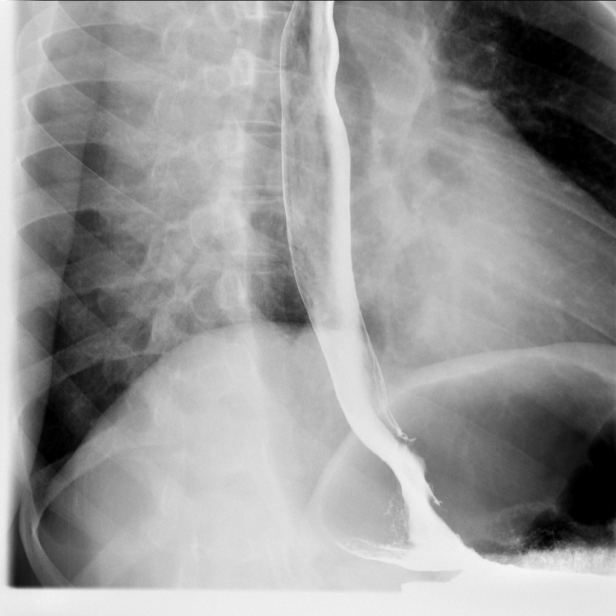

[Series 5: fluoro_barium 2fps_bw · 0.19mm/px · 1 of 1 slices shown (4 of 10)]
[im 1/1]
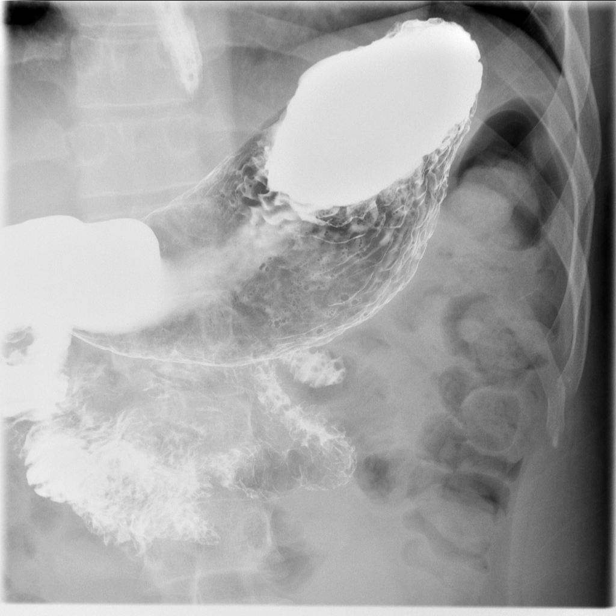

[Series 6: fluoro_barium 2fps_bw · 0.19mm/px · 1 of 1 slices shown (5 of 10)]
[im 1/1]
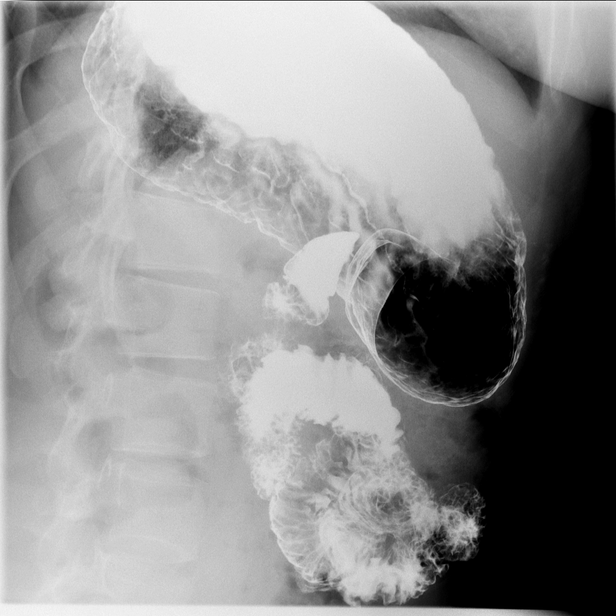

[Series 7: fluoro_barium 2fps_bw · 0.19mm/px · 1 of 1 slices shown (6 of 10)]
[im 1/1]
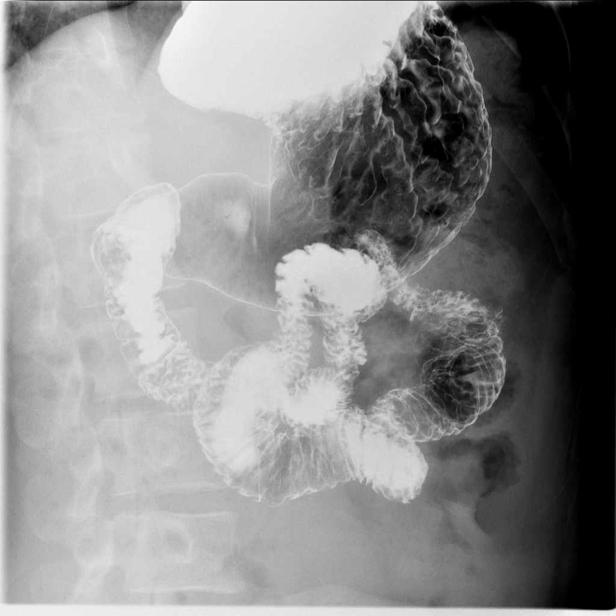

[Series 8: fluoro_barium 2fps_bw · 0.20mm/px · 1 of 1 slices shown (7 of 10)]
[im 1/1]
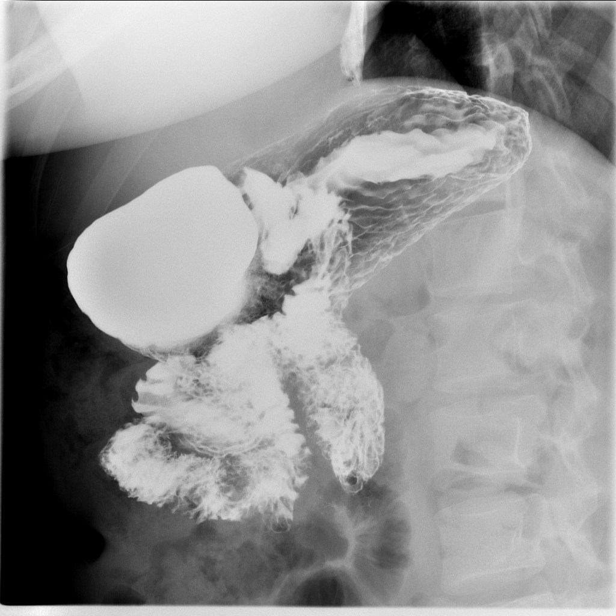

[Series 9: fluoro_barium 2fps_bw · 0.20mm/px · 1 of 1 slices shown (8 of 10)]
[im 1/1]
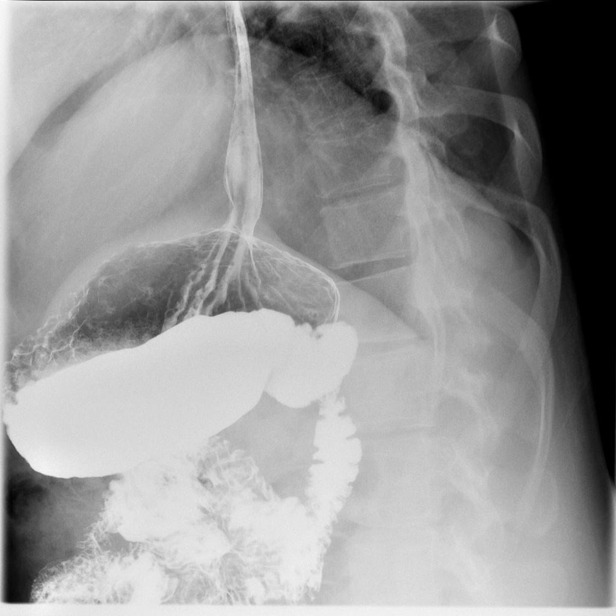

[Series 10: fluoro_barium 2fps_bw · 0.20mm/px · 1 of 1 slices shown (9 of 10)]
[im 1/1]
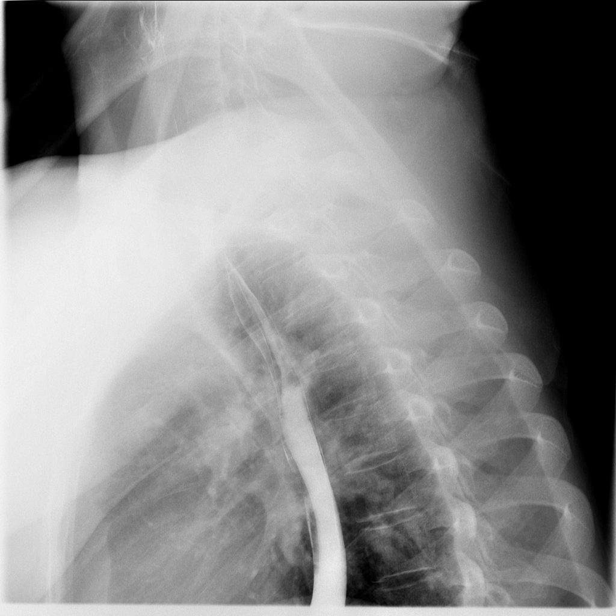

[Series 11: fluoro_barium 2fps_bw · 0.20mm/px · 1 of 1 slices shown (10 of 10)]
[im 1/1]
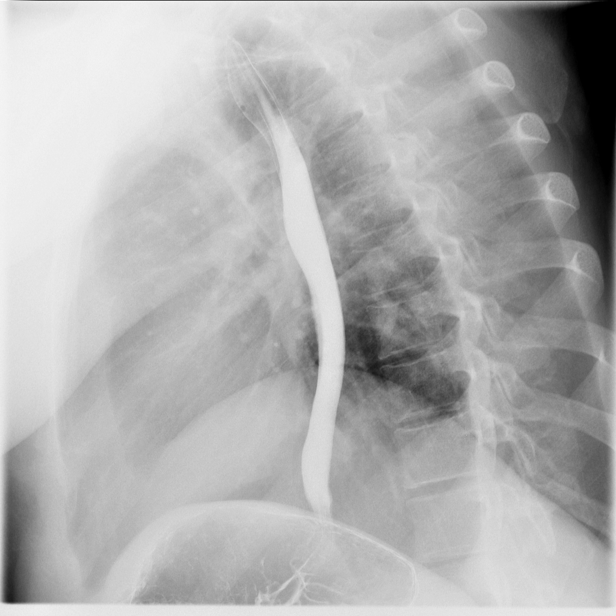

[Series 15: fluoro_iodine 2fps_bw · 0.19mm/px · 1 of 1 slices shown (1 of 3)]
[im 1/1]
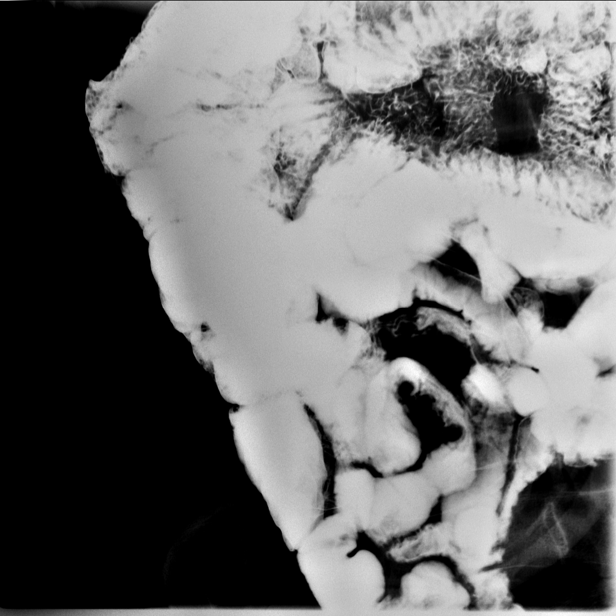

[Series 16: fluoro_iodine 2fps_bw · 0.18mm/px · 1 of 1 slices shown (2 of 3)]
[im 1/1]
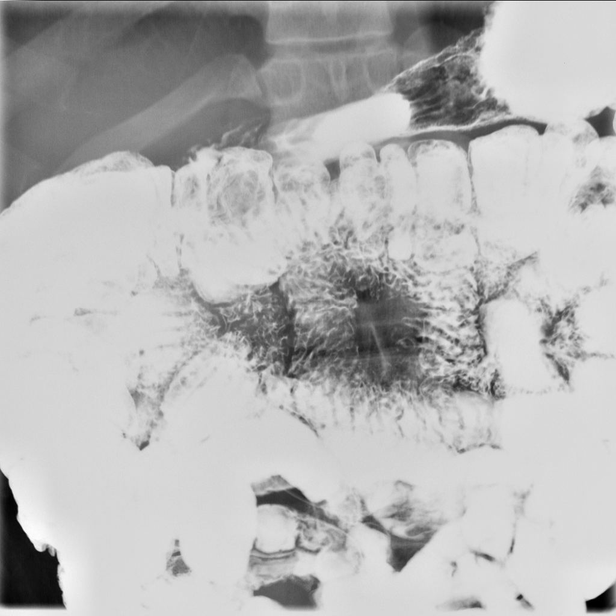

[Series 17: fluoro_iodine 2fps_bw · 0.18mm/px · 1 of 1 slices shown (3 of 3)]
[im 1/1]
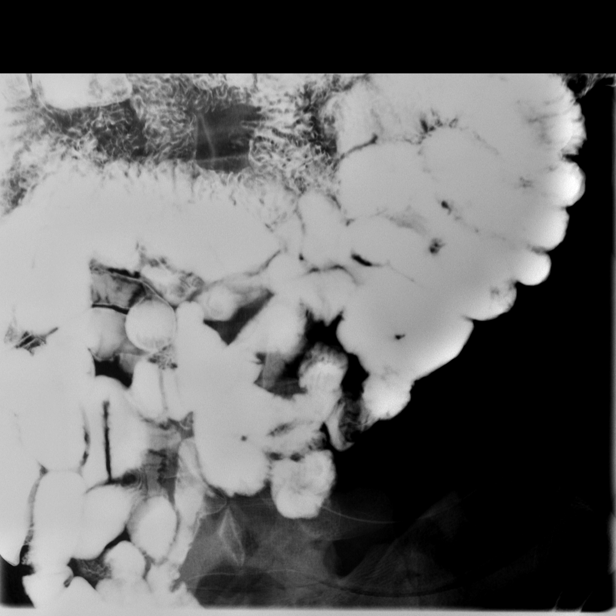

[13 of 13 positions shown; findings below may reference images not displayed]

FINDINGS: Standard upper GI with small bowel follow-through performed. The
esophagus and stomach appear normal. No evidence of reflux or peptic ulcer
disease. Small bowel follow-through appears normal. No evidence of bowel
distention. Small bowel fold thickness is normal. Terminal ileal region is
normal.
IMPRESSION: Normal exam.

## 2014-08-29 ENCOUNTER — Encounter: Payer: Self-pay | Admitting: Emergency Medicine

## 2014-08-29 ENCOUNTER — Ambulatory Visit: Payer: Medicare Other

## 2014-08-29 ENCOUNTER — Ambulatory Visit
Admission: EM | Admit: 2014-08-29 | Discharge: 2014-08-29 | Disposition: A | Discharge status: Home / Self Care | Payer: Medicare Other | Attending: Family Medicine | Admitting: Family Medicine

## 2014-08-29 DIAGNOSIS — X58XXXA Exposure to other specified factors, initial encounter: Secondary | ICD-10-CM | POA: Insufficient documentation

## 2014-08-29 DIAGNOSIS — R590 Localized enlarged lymph nodes: Secondary | ICD-10-CM | POA: Insufficient documentation

## 2014-08-29 DIAGNOSIS — S66911A Strain of unspecified muscle, fascia and tendon at wrist and hand level, right hand, initial encounter: Secondary | ICD-10-CM | POA: Diagnosis not present

## 2014-08-29 DIAGNOSIS — R1030 Lower abdominal pain, unspecified: Secondary | ICD-10-CM | POA: Diagnosis present

## 2014-08-29 DIAGNOSIS — L309 Dermatitis, unspecified: Secondary | ICD-10-CM | POA: Insufficient documentation

## 2014-08-29 DIAGNOSIS — R599 Enlarged lymph nodes, unspecified: Secondary | ICD-10-CM | POA: Diagnosis not present

## 2014-08-29 DIAGNOSIS — M25431 Effusion, right wrist: Secondary | ICD-10-CM | POA: Diagnosis not present

## 2014-08-29 DIAGNOSIS — R21 Rash and other nonspecific skin eruption: Secondary | ICD-10-CM | POA: Diagnosis present

## 2014-08-29 DIAGNOSIS — M25531 Pain in right wrist: Secondary | ICD-10-CM | POA: Diagnosis present

## 2014-08-29 DIAGNOSIS — S6991XA Unspecified injury of right wrist, hand and finger(s), initial encounter: Secondary | ICD-10-CM | POA: Diagnosis not present

## 2014-08-29 HISTORY — DX: Anxiety disorder, unspecified: F41.9

## 2014-08-29 LAB — URINALYSIS COMPLETE WITH MICROSCOPIC (ARMC ONLY)
Bacteria, UA: NONE SEEN — AB
Glucose, UA: NEGATIVE mg/dL
HGB URINE DIPSTICK: NEGATIVE
KETONES UR: NEGATIVE mg/dL
Leukocytes, UA: NEGATIVE
NITRITE: NEGATIVE
PROTEIN: 30 mg/dL — AB
Specific Gravity, Urine: 1.025 (ref 1.005–1.030)
Squamous Epithelial / LPF: NONE SEEN — AB
pH: 5.5 (ref 5.0–8.0)

## 2014-08-29 MED ORDER — AZITHROMYCIN 500 MG PO TABS
1000.0000 mg | ORAL_TABLET | Freq: Once | ORAL | Status: AC
  Administered 2014-08-29: 1000 mg via ORAL

## 2014-08-29 MED ORDER — CEFTRIAXONE SODIUM 250 MG IJ SOLR
250.0000 mg | Freq: Once | INTRAMUSCULAR | Status: AC
  Administered 2014-08-29: 250 mg via INTRAMUSCULAR

## 2014-08-29 MED ORDER — TRIAMCINOLONE ACETONIDE 0.025 % EX OINT: 1.0000 "application " | TOPICAL_OINTMENT | Freq: Two times a day (BID) | CUTANEOUS | Status: DC
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
## 2014-08-30 LAB — CHLAMYDIA/NGC RT PCR (ARMC ONLY)
Chlamydia Tr: DETECTED
N GONORRHOEAE: NOT DETECTED

## 2014-09-14 ENCOUNTER — Emergency Department: Payer: Medicare Other

## 2014-09-14 ENCOUNTER — Emergency Department
Admission: EM | Admit: 2014-09-14 | Discharge: 2014-09-14 | Disposition: A | Discharge status: Home / Self Care | Payer: Medicare Other | Attending: Student | Admitting: Student

## 2014-09-14 ENCOUNTER — Encounter: Payer: Self-pay | Admitting: Emergency Medicine

## 2014-09-14 DIAGNOSIS — S0093XA Contusion of unspecified part of head, initial encounter: Secondary | ICD-10-CM | POA: Diagnosis not present

## 2014-09-14 DIAGNOSIS — Y92410 Unspecified street and highway as the place of occurrence of the external cause: Secondary | ICD-10-CM | POA: Diagnosis not present

## 2014-09-14 DIAGNOSIS — Z79899 Other long term (current) drug therapy: Secondary | ICD-10-CM | POA: Insufficient documentation

## 2014-09-14 DIAGNOSIS — Z720 Tobacco use: Secondary | ICD-10-CM | POA: Insufficient documentation

## 2014-09-14 DIAGNOSIS — S0990XA Unspecified injury of head, initial encounter: Secondary | ICD-10-CM | POA: Diagnosis present

## 2014-09-14 DIAGNOSIS — R51 Headache: Secondary | ICD-10-CM | POA: Diagnosis not present

## 2014-09-14 DIAGNOSIS — Y998 Other external cause status: Secondary | ICD-10-CM | POA: Diagnosis not present

## 2014-09-14 DIAGNOSIS — Y9389 Activity, other specified: Secondary | ICD-10-CM | POA: Insufficient documentation

## 2014-09-14 MED ORDER — IBUPROFEN 800 MG PO TABS: 800.0000 mg | ORAL_TABLET | Freq: Three times a day (TID) | ORAL | Status: DC | PRN

## 2014-09-14 MED ORDER — CYCLOBENZAPRINE HCL 10 MG PO TABS: 10.0000 mg | ORAL_TABLET | Freq: Three times a day (TID) | ORAL | Status: DC | PRN
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
## 2014-09-14 NOTE — Discharge Instructions (Signed)

## 2014-09-30 DIAGNOSIS — F331 Major depressive disorder, recurrent, moderate: Secondary | ICD-10-CM | POA: Diagnosis not present

## 2014-09-30 DIAGNOSIS — F410 Panic disorder [episodic paroxysmal anxiety] without agoraphobia: Secondary | ICD-10-CM | POA: Diagnosis not present

## 2014-10-24 DIAGNOSIS — F410 Panic disorder [episodic paroxysmal anxiety] without agoraphobia: Secondary | ICD-10-CM | POA: Diagnosis not present

## 2014-10-24 DIAGNOSIS — Z79899 Other long term (current) drug therapy: Secondary | ICD-10-CM | POA: Diagnosis not present

## 2014-10-24 DIAGNOSIS — F331 Major depressive disorder, recurrent, moderate: Secondary | ICD-10-CM | POA: Diagnosis not present

## 2014-10-27 DIAGNOSIS — L02415 Cutaneous abscess of right lower limb: Secondary | ICD-10-CM | POA: Diagnosis not present

## 2014-10-27 DIAGNOSIS — F17210 Nicotine dependence, cigarettes, uncomplicated: Secondary | ICD-10-CM | POA: Diagnosis not present

## 2014-10-27 DIAGNOSIS — F419 Anxiety disorder, unspecified: Secondary | ICD-10-CM | POA: Diagnosis not present

## 2014-10-31 DIAGNOSIS — F17210 Nicotine dependence, cigarettes, uncomplicated: Secondary | ICD-10-CM | POA: Diagnosis not present

## 2014-10-31 DIAGNOSIS — L02415 Cutaneous abscess of right lower limb: Secondary | ICD-10-CM | POA: Diagnosis not present

## 2014-11-01 ENCOUNTER — Other Ambulatory Visit: Payer: Self-pay | Admitting: Internal Medicine

## 2014-11-01 ENCOUNTER — Ambulatory Visit: Payer: Self-pay | Admitting: Family Medicine

## 2014-11-02 ENCOUNTER — Other Ambulatory Visit: Payer: Self-pay

## 2014-11-02 ENCOUNTER — Ambulatory Visit (INDEPENDENT_AMBULATORY_CARE_PROVIDER_SITE_OTHER): Payer: Medicare Other | Admitting: Family Medicine

## 2014-11-02 ENCOUNTER — Encounter: Payer: Self-pay | Admitting: Family Medicine

## 2014-11-02 VITALS — BP 110/70 | HR 72 | Ht 66.0 in | Wt 148.8 lb

## 2014-11-02 DIAGNOSIS — R1310 Dysphagia, unspecified: Secondary | ICD-10-CM | POA: Insufficient documentation

## 2014-11-02 DIAGNOSIS — F200 Paranoid schizophrenia: Secondary | ICD-10-CM

## 2014-11-02 DIAGNOSIS — B009 Herpesviral infection, unspecified: Secondary | ICD-10-CM | POA: Insufficient documentation

## 2014-11-02 DIAGNOSIS — I10 Essential (primary) hypertension: Secondary | ICD-10-CM | POA: Insufficient documentation

## 2014-11-02 DIAGNOSIS — K219 Gastro-esophageal reflux disease without esophagitis: Secondary | ICD-10-CM | POA: Insufficient documentation

## 2014-11-02 DIAGNOSIS — L02415 Cutaneous abscess of right lower limb: Secondary | ICD-10-CM

## 2014-11-02 DIAGNOSIS — K277 Chronic peptic ulcer, site unspecified, without hemorrhage or perforation: Secondary | ICD-10-CM | POA: Insufficient documentation

## 2014-11-02 DIAGNOSIS — I059 Rheumatic mitral valve disease, unspecified: Secondary | ICD-10-CM | POA: Insufficient documentation

## 2014-11-02 DIAGNOSIS — M549 Dorsalgia, unspecified: Secondary | ICD-10-CM | POA: Insufficient documentation

## 2014-11-02 HISTORY — DX: Paranoid schizophrenia: F20.0

## 2014-11-02 MED ORDER — TRAMADOL HCL 50 MG PO TABS: 50.0000 mg | ORAL_TABLET | Freq: Four times a day (QID) | ORAL | Status: DC | PRN

## 2014-11-02 MED ORDER — DOXYCYCLINE HYCLATE 100 MG PO CAPS
100.0000 mg | ORAL_CAPSULE | Freq: Two times a day (BID) | ORAL | Status: DC
Start: 2014-11-02 — End: 2015-01-27

## 2014-11-02 NOTE — Progress Notes (Signed)
Date:  11/02/2014   Name:  Lucas Guerra   DOB:  Jun 02, 1985   MRN:  [id number]  PCP:  Bari Edward, MD    Chief Complaint: Abscess   History of Present Illness:  This is a 29 y.o. male with 2 week hx of R thigh lesion, seen [facility] urgent care last week, I+D done (?culture) and guaze placed, also placed on Bactrim for 7 days, failed to improved, seen in f/u and I+D repeated, still not better, now with significant pain and subjective fever/chills.  Review of Systems:  Review of Systems  Patient Active Problem List   Diagnosis Date Noted  . 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 Paranoid schizophrenia 11/02/2014  . Acid reflux 11/02/2014  . Chronic peptic ulcer without hemorrhage or perforation 11/02/2014  . Dysphagia 11/02/2014  . Disorder of mitral valve 11/02/2014  . Back ache 11/02/2014  . BP (high blood pressure) 11/02/2014    Prior to Admission medications   Medication Sig Start Date End Date Taking? Authorizing Provider  ALPRAZolam Prudy Feeler) 1 MG tablet Take 1 mg by mouth at bedtime as needed for anxiety.   Yes Historical Provider, MD  citalopram (CELEXA) 20 MG tablet Take 20 mg by mouth daily.   Yes Historical Provider, MD  ibuprofen (ADVIL,MOTRIN) 800 MG tablet Take 1 tablet (800 mg total) by mouth every 8 (eight) hours as needed. 09/14/14  Yes Charmayne Sheer Beers, PA-C  doxepin (SINEQUAN) 10 MG capsule Take 1 capsule by mouth at bedtime. 10/24/14   Historical Provider, MD  QUEtiapine (SEROQUEL) 200 MG tablet Take 1 tablet by mouth daily at 2 PM daily at 2 PM.    Historical Provider, MD    No Known Allergies  No past surgical history on file.  Social History  Substance Use Topics  . Smoking status: Current Every Day Smoker -- 0.50 packs/day    Types: Cigarettes  . Smokeless tobacco: None  . Alcohol Use: No    No family history on file.  Medication list has been reviewed and updated.  Physical Examination: BP 110/70 mmHg  Pulse 72  Ht 5\' 6"  (1.676 m)  Wt 148 lb  12.8 oz (67.495 kg)  BMI 24.03 kg/m2  Physical Exam  Constitutional: He is oriented to person, place, and time. He appears well-developed and well-nourished. No distress.  Neurological: He is alert and oriented to person, place, and time.  Skin: He is not diaphoretic.  Indurated, moderately tender cystic lesion R upper thigh with purulent drainage, approx 5 cm in diameter  Psychiatric: He has a normal mood and affect. His behavior is normal.    Assessment and Plan:  1. Abscess of right leg S/p I+D x2 and abxs without resolutiion, [place] [facility] to see 9AM tomorrow for definitive rx - Ambulatory referral to General Surgery - doxycycline (VIBRAMYCIN) 100 MG capsule; Take 1 capsule (100 mg total) by mouth 2 (two) times daily.  Dispense: 14 capsule; Refill: 0 - traMADol (ULTRAM) 50 MG tablet; Take 1 tablet (50 mg total) by mouth every 6 (six) hours as needed.  Dispense: 15 tablet; Refill: 0  Return if symptoms worsen or fail to improve.  [name] MD [facility]  11/02/2014

## 2014-11-03 ENCOUNTER — Other Ambulatory Visit: Payer: Self-pay | Admitting: Family Medicine

## 2014-11-03 ENCOUNTER — Encounter: Payer: Self-pay | Admitting: Surgery

## 2014-11-03 ENCOUNTER — Ambulatory Visit (INDEPENDENT_AMBULATORY_CARE_PROVIDER_SITE_OTHER): Payer: Medicare Other | Admitting: Surgery

## 2014-11-03 VITALS — BP 130/67 | HR 68 | Temp 98.0°F | Ht 66.0 in | Wt 150.0 lb

## 2014-11-03 DIAGNOSIS — L03119 Cellulitis of unspecified part of limb: Secondary | ICD-10-CM

## 2014-11-03 DIAGNOSIS — L02419 Cutaneous abscess of limb, unspecified: Secondary | ICD-10-CM

## 2014-11-03 MED ORDER — HYDROCODONE-ACETAMINOPHEN 10-325 MG PO TABS: 1.0000 | ORAL_TABLET | Freq: Four times a day (QID) | ORAL | Status: DC | PRN

## 2014-11-03 NOTE — Progress Notes (Signed)
  Surgical Consultation  11/03/2014  Lucas Guerra is an 29 y.o. male.   CC: Right thigh abscess  HPI: This patient who is at [facility] last week on the fourth and then again on the sixth had drainage and repeated drainage performed at that time. He has been on to antibiotic's and states that he is having ongoing pain on his right thigh he denies fevers or chills.  He has had this happen once before in his left groin  Past Medical History  Diagnosis Date  . Anxiety   . Heart murmur     History reviewed. No pertinent past surgical history.  Family History  Problem Relation Age of Onset  . Hypertension Mother   . Diabetes Father     Social History:  reports that he has been smoking Cigarettes.  He has been smoking about 0.50 packs per day. He has never used smokeless tobacco. He reports that he drinks alcohol. He reports that he does not use illicit drugs.  Allergies:  Allergies  Allergen Reactions  . Tramadol Rash    Medications reviewed.   Review of Systems:   Review of Systems  Constitutional: Negative for fever and chills.  Musculoskeletal: Negative.   Skin: Negative.      Physical Exam:  BP 130/67 mmHg  Pulse 68  Temp(Src) 98 F (36.7 C)  Ht 5\' 6"  (1.676 m)  Wt 150 lb (68.04 kg)  BMI 24.22 kg/m2  Physical Exam  Constitutional: He is well-developed, well-nourished, and in no distress. No distress.  HENT:  Head: Normocephalic and atraumatic.  Eyes: No scleral icterus.  Skin: Skin is warm and dry. No rash noted. No erythema. No pallor.  Right lateral thigh wound with no erythema, eschars present, no expressible pus, surrounding induration is present with minimal tenderness. No sign of abscess.  Psychiatric: Mood and affect normal.      No results found for this or any previous visit (from the past 48 hour(s)). No results found.  Assessment/Plan:  Abscess right thigh which was drained at [facility] last week. He is currently on  antibiotic's, was on Bactrim but is now on an unknown antibiotic that he is not sure of. I recommended that he can finish out that prescription of antibiotic's and follow-up on an as-needed basis. His wound is healing well and there is no sign of active or worsening infection at this time.  Lattie Haw, MD, FACS

## 2014-11-04 DIAGNOSIS — L02415 Cutaneous abscess of right lower limb: Secondary | ICD-10-CM | POA: Diagnosis not present

## 2014-11-04 DIAGNOSIS — L03115 Cellulitis of right lower limb: Secondary | ICD-10-CM | POA: Diagnosis not present

## 2014-11-04 DIAGNOSIS — F17210 Nicotine dependence, cigarettes, uncomplicated: Secondary | ICD-10-CM | POA: Diagnosis not present

## 2014-11-13 ENCOUNTER — Encounter: Payer: Self-pay | Admitting: Emergency Medicine

## 2014-11-13 ENCOUNTER — Emergency Department
Admission: EM | Admit: 2014-11-13 | Discharge: 2014-11-13 | Disposition: A | Discharge status: Home / Self Care | Payer: Medicare Other | Attending: Emergency Medicine | Admitting: Emergency Medicine

## 2014-11-13 DIAGNOSIS — Z720 Tobacco use: Secondary | ICD-10-CM | POA: Insufficient documentation

## 2014-11-13 DIAGNOSIS — T148 Other injury of unspecified body region: Secondary | ICD-10-CM | POA: Insufficient documentation

## 2014-11-13 DIAGNOSIS — Y998 Other external cause status: Secondary | ICD-10-CM | POA: Insufficient documentation

## 2014-11-13 DIAGNOSIS — Z79899 Other long term (current) drug therapy: Secondary | ICD-10-CM | POA: Insufficient documentation

## 2014-11-13 DIAGNOSIS — Y9389 Activity, other specified: Secondary | ICD-10-CM | POA: Insufficient documentation

## 2014-11-13 DIAGNOSIS — Y9289 Other specified places as the place of occurrence of the external cause: Secondary | ICD-10-CM | POA: Diagnosis not present

## 2014-11-13 DIAGNOSIS — S70361A Insect bite (nonvenomous), right thigh, initial encounter: Secondary | ICD-10-CM | POA: Diagnosis not present

## 2014-11-13 DIAGNOSIS — W57XXXA Bitten or stung by nonvenomous insect and other nonvenomous arthropods, initial encounter: Secondary | ICD-10-CM | POA: Insufficient documentation

## 2014-11-13 DIAGNOSIS — M79604 Pain in right leg: Secondary | ICD-10-CM | POA: Diagnosis present

## 2014-11-13 LAB — COMPREHENSIVE METABOLIC PANEL
ALT: 14 U/L — AB (ref 17–63)
ANION GAP: 7 (ref 5–15)
AST: 20 U/L (ref 15–41)
Albumin: 4.1 g/dL (ref 3.5–5.0)
Alkaline Phosphatase: 74 U/L (ref 38–126)
BUN: 16 mg/dL (ref 6–20)
CO2: 29 mmol/L (ref 22–32)
CREATININE: 1.09 mg/dL (ref 0.61–1.24)
Calcium: 9.2 mg/dL (ref 8.9–10.3)
Chloride: 103 mmol/L (ref 101–111)
Glucose, Bld: 87 mg/dL (ref 65–99)
POTASSIUM: 4.2 mmol/L (ref 3.5–5.1)
SODIUM: 139 mmol/L (ref 135–145)
Total Bilirubin: 0.2 mg/dL — ABNORMAL LOW (ref 0.3–1.2)
Total Protein: 7.2 g/dL (ref 6.5–8.1)

## 2014-11-13 LAB — CBC WITH DIFFERENTIAL/PLATELET
BASOS ABS: 0 10*3/uL (ref 0–0.1)
BASOS PCT: 1 %
Eosinophils Absolute: 0.1 10*3/uL (ref 0–0.7)
Eosinophils Relative: 2 %
HEMATOCRIT: 45.9 % (ref 40.0–52.0)
HEMOGLOBIN: 14.8 g/dL (ref 13.0–18.0)
Lymphocytes Relative: 26 %
Lymphs Abs: 1.5 10*3/uL (ref 1.0–3.6)
MCH: 29.6 pg (ref 26.0–34.0)
MCHC: 32.3 g/dL (ref 32.0–36.0)
MCV: 91.6 fL (ref 80.0–100.0)
MONOS PCT: 7 %
Monocytes Absolute: 0.4 10*3/uL (ref 0.2–1.0)
NEUTROS ABS: 3.8 10*3/uL (ref 1.4–6.5)
NEUTROS PCT: 64 %
Platelets: 222 10*3/uL (ref 150–440)
RBC: 5.01 MIL/uL (ref 4.40–5.90)
RDW: 13.3 % (ref 11.5–14.5)
WBC: 5.8 10*3/uL (ref 3.8–10.6)

## 2014-11-13 LAB — LIPASE, BLOOD: LIPASE: 21 U/L — AB (ref 22–51)

## 2014-11-13 MED ORDER — DOXYCYCLINE HYCLATE 100 MG PO TABS
100.0000 mg | ORAL_TABLET | Freq: Once | ORAL | Status: AC
  Administered 2014-11-13: 100 mg via ORAL

## 2014-11-13 MED ORDER — KETOROLAC TROMETHAMINE 10 MG PO TABS
10.0000 mg | ORAL_TABLET | Freq: Once | ORAL | Status: AC
  Administered 2014-11-13: 10 mg via ORAL

## 2014-11-13 MED ORDER — KETOROLAC TROMETHAMINE 10 MG PO TABS
ORAL_TABLET | ORAL | Status: AC
  Administered 2014-11-13: 10 mg via ORAL
  Filled 2014-11-13: qty 1

## 2014-11-13 MED ORDER — ONDANSETRON HCL 4 MG PO TABS: ORAL_TABLET | ORAL | Status: DC

## 2014-11-13 MED ORDER — KETOROLAC TROMETHAMINE 30 MG/ML IJ SOLN
INTRAMUSCULAR | Status: AC
  Filled 2014-11-13: qty 1

## 2014-11-13 MED ORDER — ONDANSETRON HCL 4 MG/2ML IJ SOLN
4.0000 mg | INTRAMUSCULAR | Status: AC
  Administered 2014-11-13: 4 mg via INTRAVENOUS
  Filled 2014-11-13: qty 2

## 2014-11-13 MED ORDER — DOXYCYCLINE HYCLATE 100 MG PO TABS
ORAL_TABLET | ORAL | Status: AC
  Administered 2014-11-13: 100 mg via ORAL
  Filled 2014-11-13: qty 1

## 2014-11-13 MED ORDER — DOXYCYCLINE HYCLATE 100 MG PO TABS: 100.0000 mg | ORAL_TABLET | Freq: Two times a day (BID) | ORAL | Status: AC

## 2014-11-13 MED ORDER — KETOROLAC TROMETHAMINE 10 MG PO TABS: 10.0000 mg | ORAL_TABLET | Freq: Three times a day (TID) | ORAL | Status: DC | PRN

## 2014-11-13 MED ORDER — SODIUM CHLORIDE 0.9 % IV BOLUS (SEPSIS)
1000.0000 mL | INTRAVENOUS | Status: AC
  Administered 2014-11-13: 1000 mL via INTRAVENOUS

## 2014-11-13 NOTE — ED Provider Notes (Signed)
[facility] Emergency Department Provider Note  ____________________________________________  Time seen: 5:40 PM  I have reviewed the triage vital signs and the nursing notes.   HISTORY  Chief Complaint Leg Pain     HPI Lucas Guerra is a 29 y.o. male Modena Jansky with right lateral leg pain times one months with generalize myalgia. Patient states that he had an abscess located in site with redness extending away from the abscess that he was seen by [facility] for patent area patient states that he was notified by [facility] that he tested positive for MRSA and was prescribed Bactrim. Patient states despite taking medication as prescribed he's had persistent generalized myalgia and discomfort since first noticing the abscess.     Past Medical History  Diagnosis Date  . Anxiety   . Heart murmur     Patient Active Problem List   Diagnosis Date Noted  . 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 Paranoid schizophrenia 11/02/2014  . Acid reflux 11/02/2014  . Chronic peptic ulcer without hemorrhage or perforation 11/02/2014  . Dysphagia 11/02/2014  . Disorder of mitral valve 11/02/2014  . Back ache 11/02/2014  . BP (high blood pressure) 11/02/2014    History reviewed. No pertinent past surgical history.  Current Outpatient Rx  Name  Route  Sig  Dispense  Refill  . ALPRAZolam (XANAX) 1 MG tablet   Oral   Take 1 mg by mouth at bedtime as needed for anxiety.         . citalopram (CELEXA) 20 MG tablet   Oral   Take 20 mg by mouth daily.         Marland Kitchen doxepin (SINEQUAN) 10 MG capsule   Oral   Take 1 capsule by mouth at bedtime.         Marland Kitchen doxycycline (VIBRAMYCIN) 100 MG capsule   Oral   Take 1 capsule (100 mg total) by mouth 2 (two) times daily.   14 capsule   0   . HYDROcodone-acetaminophen (NORCO) 10-325 MG per tablet   Oral   Take 1 tablet by mouth every 6 (six) hours as needed for moderate pain.   15 tablet   0   . ibuprofen  (ADVIL,MOTRIN) 800 MG tablet   Oral   Take 1 tablet (800 mg total) by mouth every 8 (eight) hours as needed.   30 tablet   0   . QUEtiapine (SEROQUEL) 200 MG tablet   Oral   Take 1 tablet by mouth daily at 2 PM daily at 2 PM.           Allergies Tramadol  Family History  Problem Relation Age of Onset  . Hypertension Mother   . Diabetes Father     Social History Social History  Substance Use Topics  . Smoking status: Current Every Day Smoker -- 0.50 packs/day    Types: Cigarettes  . Smokeless tobacco: Never Used  . Alcohol Use: 0.0 oz/week    0 Standard drinks or equivalent per week     Comment: rare consumption    Review of Systems  Constitutional: Negative for fever. Eyes: Negative for visual changes. ENT: Negative for sore throat. Cardiovascular: Negative for chest pain. Respiratory: Negative for shortness of breath. Gastrointestinal: Negative for abdominal pain, vomiting and diarrhea. Genitourinary: Negative for dysuria. Musculoskeletal: Negative for back pain. Skin: Negative for rash. Neurological: Negative for headaches, focal weakness or numbness.   10-point ROS otherwise negative.  ____________________________________________   PHYSICAL EXAM:  VITAL SIGNS:  ED Triage Vitals  Enc Vitals Group     BP 11/13/14 1737 134/77 mmHg     Pulse Rate 11/13/14 1737 88     Resp 11/13/14 1737 18     Temp 11/13/14 1737 98.2 F (36.8 C)     Temp Source 11/13/14 1737 Oral     SpO2 11/13/14 1737 100 %     Weight 11/13/14 1737 140 lb (63.504 kg)     Height 11/13/14 1737 5\' 6"  (1.676 m)     Head Cir --      Peak Flow --      Pain Score 11/13/14 1758 8     Pain Loc --      Pain Edu? --      Excl. in GC? --    Constitutional: Alert and oriented. Well appearing and in no distress. Eyes: Conjunctivae are normal. PERRL. Normal extraocular movements. ENT   Head: Normocephalic and atraumatic.   Nose: No congestion/rhinnorhea.   Mouth/Throat: Mucous  membranes are moist.   Neck: No stridor. Hematological/Lymphatic/Immunilogical: No cervical lymphadenopathy. Cardiovascular: Normal rate, regular rhythm. Normal and symmetric distal pulses are present in all extremities. No murmurs, rubs, or gallops. Respiratory: Normal respiratory effort without tachypnea nor retractions. Breath sounds are clear and equal bilaterally. No wheezes/rales/rhonchi. Gastrointestinal: Soft and nontender. No distention. There is no CVA tenderness. Genitourinary: deferred Musculoskeletal: Nontender with normal range of motion in all extremities. No joint effusions.  No lower extremity tenderness nor edema. Neurologic:  Normal speech and language. No gross focal neurologic deficits are appreciated. Speech is normal.  Skin:  Skin is warm, dry and intact. No rash noted. Well healed wound noted to the lateral portion of the patient's right thigh. No flocculence no erythema Psychiatric: Mood and affect are normal. Speech and behavior are normal. Patient exhibits appropriate insight and judgment.  ____________________________________________    LABS (pertinent positives/negatives)  Labs Reviewed  COMPREHENSIVE METABOLIC PANEL - Abnormal; Notable for the following:    ALT 14 (*)    Total Bilirubin 0.2 (*)    All other components within normal limits  LIPASE, BLOOD - Abnormal; Notable for the following:    Lipase 21 (*)    All other components within normal limits  CBC WITH DIFFERENTIAL/PLATELET  B. BURGDORFI ANTIBODIES     _   INITIAL IMPRESSION / ASSESSMENT AND PLAN / ED COURSE  Pertinent labs & imaging results that were available during my care of the patient were reviewed by me and considered in my medical decision making (see chart for details).  I reviewed the records patient has a [facility] and [facility] concern for possible Lyme disease as the initial insult to the patient. As such patient given doxycycline 100 mg while we await results from Lyme  testing.  ____________________________________________   FINAL CLINICAL IMPRESSION(S) / ED DIAGNOSES  Final diagnoses:  Insect bite      Darci Current, MD 11/15/14 0000

## 2014-11-13 NOTE — ED Notes (Signed)
AAOx3.  Skin warm and dry. Ambulates with easy and steady gait. 

## 2014-11-13 NOTE — Discharge Instructions (Signed)

## 2014-11-13 NOTE — ED Provider Notes (Signed)
-----------------------------------------   7:34 PM on 11/13/2014 -----------------------------------------   Blood pressure 134/77, pulse 88, temperature 98.2 F (36.8 C), temperature source Oral, resp. rate 18, height 5\' 6"  (1.676 m), weight 140 lb (63.504 kg), SpO2 100 %.  Assuming care from Dr. Manson Passey.  In short, Lucas Guerra is a 29 y.o. male with a chief complaint of Leg Pain .  Refer to the original H&P for additional details.  The current plan of care is to follow-up labs.  In short, the patient was being dischargedwith doxycycline for possible tick exposure when he had an episode of vomiting.  Dr. Manson Passey asked me to order basic labs, provide some IV antiemetics and IV fluids, and reassess.   (Note that documentation was delayed due to multiple ED patients requiring immediate care.)   The patient's labs were unremarkable and he feels a little bit better with improved nausea after IV fluids and Zofran.  I will discharge him as per Dr. Theora Gianotti original plan except added a prescription for Zofran.  Loleta Rose, MD 11/14/14 [phone]

## 2014-11-15 LAB — B. BURGDORFI ANTIBODIES: B burgdorferi Ab IgG+IgM: <0.91 {ISR} (ref 0.00–0.90)

## 2015-01-17 DIAGNOSIS — Z23 Encounter for immunization: Secondary | ICD-10-CM | POA: Diagnosis not present

## 2015-01-27 ENCOUNTER — Emergency Department
Admission: EM | Admit: 2015-01-27 | Discharge: 2015-01-27 | Discharge status: Against Medical Advice | Payer: Medicare Other | Attending: Emergency Medicine | Admitting: Emergency Medicine

## 2015-01-27 ENCOUNTER — Encounter: Payer: Self-pay | Admitting: *Deleted

## 2015-01-27 DIAGNOSIS — S0993XA Unspecified injury of face, initial encounter: Secondary | ICD-10-CM | POA: Insufficient documentation

## 2015-01-27 DIAGNOSIS — Y9355 Activity, bike riding: Secondary | ICD-10-CM | POA: Insufficient documentation

## 2015-01-27 DIAGNOSIS — Z720 Tobacco use: Secondary | ICD-10-CM | POA: Insufficient documentation

## 2015-01-27 DIAGNOSIS — Y9289 Other specified places as the place of occurrence of the external cause: Secondary | ICD-10-CM | POA: Diagnosis not present

## 2015-01-27 DIAGNOSIS — Y998 Other external cause status: Secondary | ICD-10-CM | POA: Insufficient documentation
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
## 2015-01-27 NOTE — ED Notes (Signed)
Called pt. To bring back to CDU room

## 2015-01-28 DIAGNOSIS — M25511 Pain in right shoulder: Secondary | ICD-10-CM | POA: Diagnosis not present

## 2015-01-28 DIAGNOSIS — Z885 Allergy status to narcotic agent status: Secondary | ICD-10-CM | POA: Diagnosis not present

## 2015-01-28 DIAGNOSIS — S199XXA Unspecified injury of neck, initial encounter: Secondary | ICD-10-CM | POA: Diagnosis not present

## 2015-01-28 DIAGNOSIS — M25571 Pain in right ankle and joints of right foot: Secondary | ICD-10-CM | POA: Diagnosis not present

## 2015-01-28 DIAGNOSIS — S0993XA Unspecified injury of face, initial encounter: Secondary | ICD-10-CM | POA: Diagnosis not present

## 2015-01-28 DIAGNOSIS — R6884 Jaw pain: Secondary | ICD-10-CM | POA: Diagnosis not present

## 2015-01-28 DIAGNOSIS — R42 Dizziness and giddiness: Secondary | ICD-10-CM | POA: Diagnosis not present

## 2015-03-14 DIAGNOSIS — F331 Major depressive disorder, recurrent, moderate: Secondary | ICD-10-CM | POA: Diagnosis not present

## 2015-03-14 DIAGNOSIS — Z79899 Other long term (current) drug therapy: Secondary | ICD-10-CM | POA: Diagnosis not present

## 2015-03-14 DIAGNOSIS — F410 Panic disorder [episodic paroxysmal anxiety] without agoraphobia: Secondary | ICD-10-CM | POA: Diagnosis not present

## 2015-03-22 DIAGNOSIS — R6884 Jaw pain: Secondary | ICD-10-CM | POA: Diagnosis not present

## 2015-04-20 DIAGNOSIS — Z8711 Personal history of peptic ulcer disease: Secondary | ICD-10-CM | POA: Diagnosis not present

## 2015-04-20 DIAGNOSIS — F17210 Nicotine dependence, cigarettes, uncomplicated: Secondary | ICD-10-CM | POA: Diagnosis not present

## 2015-04-20 DIAGNOSIS — F209 Schizophrenia, unspecified: Secondary | ICD-10-CM | POA: Diagnosis not present

## 2015-04-20 DIAGNOSIS — I10 Essential (primary) hypertension: Secondary | ICD-10-CM | POA: Diagnosis not present

## 2015-04-20 DIAGNOSIS — R1013 Epigastric pain: Secondary | ICD-10-CM | POA: Diagnosis not present

## 2015-04-20 DIAGNOSIS — G8929 Other chronic pain: Secondary | ICD-10-CM | POA: Diagnosis not present

## 2015-04-20 DIAGNOSIS — Z885 Allergy status to narcotic agent status: Secondary | ICD-10-CM | POA: Diagnosis not present

## 2015-04-20 DIAGNOSIS — K219 Gastro-esophageal reflux disease without esophagitis: Secondary | ICD-10-CM | POA: Diagnosis not present

## 2015-05-30 ENCOUNTER — Emergency Department
Admission: EM | Admit: 2015-05-30 | Discharge: 2015-05-30 | Disposition: A | Discharge status: Home / Self Care | Payer: Medicare Other | Attending: Emergency Medicine | Admitting: Emergency Medicine

## 2015-05-30 ENCOUNTER — Encounter: Payer: Self-pay | Admitting: Emergency Medicine

## 2015-05-30 DIAGNOSIS — Z79899 Other long term (current) drug therapy: Secondary | ICD-10-CM | POA: Diagnosis not present

## 2015-05-30 DIAGNOSIS — R1111 Vomiting without nausea: Secondary | ICD-10-CM | POA: Diagnosis not present

## 2015-05-30 DIAGNOSIS — R1010 Upper abdominal pain, unspecified: Secondary | ICD-10-CM | POA: Insufficient documentation

## 2015-05-30 DIAGNOSIS — G8929 Other chronic pain: Secondary | ICD-10-CM | POA: Diagnosis not present

## 2015-05-30 DIAGNOSIS — R112 Nausea with vomiting, unspecified: Secondary | ICD-10-CM | POA: Diagnosis not present

## 2015-05-30 DIAGNOSIS — F17210 Nicotine dependence, cigarettes, uncomplicated: Secondary | ICD-10-CM | POA: Insufficient documentation

## 2015-05-30 HISTORY — DX: Gastro-esophageal reflux disease without esophagitis: K21.9

## 2015-05-30 LAB — LIPASE, BLOOD: Lipase: 15 U/L (ref 11–51)

## 2015-05-30 LAB — COMPREHENSIVE METABOLIC PANEL
ALT: 14 U/L — ABNORMAL LOW (ref 17–63)
ANION GAP: 6 (ref 5–15)
AST: 21 U/L (ref 15–41)
Albumin: 4.1 g/dL (ref 3.5–5.0)
Alkaline Phosphatase: 72 U/L (ref 38–126)
BILIRUBIN TOTAL: 0.7 mg/dL (ref 0.3–1.2)
BUN: 13 mg/dL (ref 6–20)
CALCIUM: 9.3 mg/dL (ref 8.9–10.3)
CO2: 29 mmol/L (ref 22–32)
Chloride: 106 mmol/L (ref 101–111)
Creatinine, Ser: 0.91 mg/dL (ref 0.61–1.24)
GFR calc Af Amer: >60 mL/min (ref >60)
Glucose, Bld: 90 mg/dL (ref 65–99)
POTASSIUM: 3.9 mmol/L (ref 3.5–5.1)
Sodium: 141 mmol/L (ref 135–145)
TOTAL PROTEIN: 6.9 g/dL (ref 6.5–8.1)

## 2015-05-30 LAB — CBC
HEMATOCRIT: 41.8 % (ref 40.0–52.0)
HEMOGLOBIN: 13.9 g/dL (ref 13.0–18.0)
MCH: 29.2 pg (ref 26.0–34.0)
MCHC: 33.2 g/dL (ref 32.0–36.0)
MCV: 87.9 fL (ref 80.0–100.0)
Platelets: 229 10*3/uL (ref 150–440)
RBC: 4.75 MIL/uL (ref 4.40–5.90)
RDW: 13.7 % (ref 11.5–14.5)
WBC: 10.2 10*3/uL (ref 3.8–10.6)

## 2015-05-30 LAB — RAPID INFLUENZA A&B ANTIGENS (ARMC ONLY)
INFLUENZA A (ARMC): NEGATIVE
INFLUENZA B (ARMC): NEGATIVE

## 2015-05-30 LAB — TROPONIN I

## 2015-05-30 MED ORDER — GI COCKTAIL ~~LOC~~
30.0000 mL | Freq: Once | ORAL | Status: AC
  Administered 2015-05-30: 30 mL via ORAL
  Filled 2015-05-30: qty 30

## 2015-05-30 MED ORDER — ONDANSETRON HCL 4 MG PO TABS: 4.0000 mg | ORAL_TABLET | Freq: Three times a day (TID) | ORAL | Status: DC | PRN

## 2015-05-30 MED ORDER — SUCRALFATE 1 G PO TABS: 1.0000 g | ORAL_TABLET | Freq: Four times a day (QID) | ORAL | Status: DC

## 2015-05-30 MED ORDER — PANTOPRAZOLE SODIUM 40 MG PO TBEC: 40.0000 mg | DELAYED_RELEASE_TABLET | Freq: Every day | ORAL | Status: DC

## 2015-05-30 NOTE — Discharge Instructions (Signed)
Please seek medical attention for any high fevers, chest pain, shortness of breath, change in behavior, persistent vomiting, bloody stool or any other new or concerning symptoms.   Abdominal Pain, Adult Many things can cause belly (abdominal) pain. Most times, the belly pain is not dangerous. Many cases of belly pain can be watched and treated at home. HOME CARE   Do not take medicines that help you go poop (laxatives) unless told to by your doctor.  Only take medicine as told by your doctor.  Eat or drink as told by your doctor. Your doctor will tell you if you should be on a special diet. GET HELP IF:  You do not know what is causing your belly pain.  You have belly pain while you are sick to your stomach (nauseous) or have runny poop (diarrhea).  You have pain while you pee or poop.  Your belly pain wakes you up at night.  You have belly pain that gets worse or better when you eat.  You have belly pain that gets worse when you eat fatty foods.  You have a fever. GET HELP RIGHT AWAY IF:   The pain does not go away within 2 hours.  You keep throwing up (vomiting).  The pain changes and is only in the right or left part of the belly.  You have bloody or tarry looking poop. MAKE SURE YOU:   Understand these instructions.  Will watch your condition.  Will get help right away if you are not doing well or get worse.   This information is not intended to replace advice given to you by your health care provider. Make sure you discuss any questions you have with your health care provider.   Document Released: 08/28/2007 Document Revised: 04/01/2014 Document Reviewed: 11/18/2012 Elsevier Interactive Patient Education 2016 [employer]  Nausea and Vomiting Nausea is a sick feeling that often comes before throwing up (vomiting). Vomiting is a reflex where stomach contents come out of your mouth. Vomiting can cause severe loss of body fluids (dehydration). Children and  elderly adults can become dehydrated quickly, especially if they also have diarrhea. Nausea and vomiting are symptoms of a condition or disease. It is important to find the cause of your symptoms. CAUSES   Direct irritation of the stomach lining. This irritation can result from increased acid production (gastroesophageal reflux disease), infection, food poisoning, taking certain medicines (such as nonsteroidal anti-inflammatory drugs), alcohol use, or tobacco use.  Signals from the brain.These signals could be caused by a headache, heat exposure, an inner ear disturbance, increased pressure in the brain from injury, infection, a tumor, or a concussion, pain, emotional stimulus, or metabolic problems.  An obstruction in the gastrointestinal tract (bowel obstruction).  Illnesses such as diabetes, hepatitis, gallbladder problems, appendicitis, kidney problems, cancer, sepsis, atypical symptoms of a heart attack, or eating disorders.  Medical treatments such as chemotherapy and radiation.  Receiving medicine that makes you sleep (general anesthetic) during surgery. DIAGNOSIS Your caregiver may ask for tests to be done if the problems do not improve after a few days. Tests may also be done if symptoms are severe or if the reason for the nausea and vomiting is not clear. Tests may include:  Urine tests.  Blood tests.  Stool tests.  Cultures (to look for evidence of infection).  X-rays or other imaging studies. Test results can help your caregiver make decisions about treatment or the need for additional tests. TREATMENT You need to stay well hydrated. Drink  frequently but in small amounts.You may wish to drink water, sports drinks, clear broth, or eat frozen ice pops or gelatin dessert to help stay hydrated.When you eat, eating slowly may help prevent nausea.There are also some antinausea medicines that may help prevent nausea. HOME CARE INSTRUCTIONS   Take all medicine as directed by  your caregiver.  If you do not have an appetite, do not force yourself to eat. However, you must continue to drink fluids.  If you have an appetite, eat a normal diet unless your caregiver tells you differently.  Eat a variety of complex carbohydrates (rice, wheat, potatoes, bread), lean meats, yogurt, fruits, and vegetables.  Avoid high-fat foods because they are more difficult to digest.  Drink enough water and fluids to keep your urine clear or pale yellow.  If you are dehydrated, ask your caregiver for specific rehydration instructions. Signs of dehydration may include:  Severe thirst.  Dry lips and mouth.  Dizziness.  Dark urine.  Decreasing urine frequency and amount.  Confusion.  Rapid breathing or pulse. SEEK IMMEDIATE MEDICAL CARE IF:   You have blood or brown flecks (like coffee grounds) in your vomit.  You have black or bloody stools.  You have a severe headache or stiff neck.  You are confused.  You have severe abdominal pain.  You have chest pain or trouble breathing.  You do not urinate at least once every 8 hours.  You develop cold or clammy skin.  You continue to vomit for longer than 24 to 48 hours.  You have a fever. MAKE SURE YOU:   Understand these instructions.  Will watch your condition.  Will get help right away if you are not doing well or get worse.   This information is not intended to replace advice given to you by your health care provider. Make sure you discuss any questions you have with your health care provider.   Document Released: 03/11/2005 Document Revised: 06/03/2011 Document Reviewed: 08/08/2010 Elsevier Interactive Patient Education [employer]

## 2015-05-30 NOTE — ED Notes (Signed)
MD Derrill Kay made aware of patient recent pain rating. No new orders. Patient given additional warm blanket.

## 2015-05-30 NOTE — ED Notes (Signed)
Patient with no complaints at this time. Respirations even and unlabored. Skin warm/dry. Discharge instructions reviewed with patient at this time. Patient given opportunity to voice concerns/ask questions. IV removed per policy and band-aid applied to site. Patient discharged at this time and left Emergency Department with steady gait.  

## 2015-05-30 NOTE — ED Notes (Signed)
Pt presents to ED via EMS from personal home with c/o of nausea/vomiting and possible flu-like sx. EMS states pt has a hx of gastric esophageal "problem". Pt is unsure of exact diagnosis and treatment plan. EMS states pt stated he has been vomiting "blood" throughout the day. Pt arrived alert and oriented x4.

## 2015-05-30 NOTE — ED Provider Notes (Signed)
[facility] Emergency Department Provider Note    ____________________________________________  Time seen: ~2030  I have reviewed the triage vital signs and the nursing notes.   HISTORY  Chief Complaint Nausea and Emesis   History limited by: Not Limited   HPI Lucas Guerra is a 30 y.o. male who presents to the emergency department today because of concern for vomiting and abdominal pain and bloody emesis. The patient has a chronic history of abdominal pain and vomiting. He has been seen at multiple hospitals and by multiple providers for these symptoms. The patient states he has been vomiting for years. He states today however he noticed some blood in his vomit. He states he had been vomiting prior to noticing bloody emesis. This is accompanied by his usual abdominal pain. He states that he is not currently on any medication for his abdominal pain or vomiting. He states he has had multiple EGDs but none recently. He denies any recent fevers. Denies any recent chest pain or shortness of breath.     Past Medical History  Diagnosis Date  . Anxiety   . Heart murmur   . GERD (gastroesophageal reflux disease)     Patient Active Problem List   Diagnosis Date Noted  . 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 Paranoid schizophrenia (HCC) 11/02/2014  . Acid reflux 11/02/2014  . Chronic peptic ulcer without hemorrhage or perforation 11/02/2014  . Dysphagia 11/02/2014  . Disorder of mitral valve 11/02/2014  . Back ache 11/02/2014  . BP (high blood pressure) 11/02/2014    History reviewed. No pertinent past surgical history.  Current Outpatient Rx  Name  Route  Sig  Dispense  Refill  . ALPRAZolam (XANAX) 1 MG tablet   Oral   Take 1 mg by mouth 2 (two) times daily as needed for anxiety.          . citalopram (CELEXA) 20 MG tablet   Oral   Take 20 mg by mouth daily.         Marland Kitchen doxepin (SINEQUAN) 10 MG capsule   Oral   Take 1 capsule by mouth at  bedtime as needed (for sleep.).            Allergies Tramadol  Family History  Problem Relation Age of Onset  . Hypertension Mother   . Diabetes Father     Social History Social History  Substance Use Topics  . Smoking status: Current Every Day Smoker -- 0.50 packs/day    Types: Cigarettes  . Smokeless tobacco: Never Used  . Alcohol Use: 0.0 oz/week    0 Standard drinks or equivalent per week     Comment: rare consumption    Review of Systems  Constitutional: Negative for fever. Cardiovascular: Negative for chest pain. Respiratory: Negative for shortness of breath. Gastrointestinal: Positive for abdominal pain, nausea and vomiting. Neurological: Negative for headaches, focal weakness or numbness.   10-point ROS otherwise negative.  ____________________________________________   PHYSICAL EXAM:  VITAL SIGNS: ED Triage Vitals  Enc Vitals Group     BP 05/30/15 2015 134/88 mmHg     Pulse Rate 05/30/15 2015 58     Resp 05/30/15 2015 20     Temp 05/30/15 2015 98.9 F (37.2 C)     Temp Source 05/30/15 2015 Oral     SpO2 05/30/15 2015 100 %     Weight 05/30/15 2015 137 lb (62.143 kg)     Height 05/30/15 2015 5\' 6"  (1.676 m)     Head  Cir --      Peak Flow --      Pain Score 05/30/15 2015 8   Constitutional: Alert and oriented. Well appearing and in no distress. Eyes: Conjunctivae are normal. PERRL. Normal extraocular movements. ENT   Head: Normocephalic and atraumatic.   Nose: No congestion/rhinnorhea.   Mouth/Throat: Mucous membranes are moist.   Neck: No stridor. Hematological/Lymphatic/Immunilogical: No cervical lymphadenopathy. Cardiovascular: Normal rate, regular rhythm.  No murmurs, rubs, or gallops. Respiratory: Normal respiratory effort without tachypnea nor retractions. Breath sounds are clear and equal bilaterally. No wheezes/rales/rhonchi. Gastrointestinal: Soft and minimally tender to percussion oriented. No rebound. No  guarding. Genitourinary: Deferred Musculoskeletal: Normal range of motion in all extremities. No joint effusions.  No lower extremity tenderness nor edema. Neurologic:  Normal speech and language. No gross focal neurologic deficits are appreciated.  Skin:  Skin is warm, dry and intact. No rash noted. Psychiatric: Mood and affect are normal. Speech and behavior are normal. Patient exhibits appropriate insight and judgment.  ____________________________________________    LABS (pertinent positives/negatives)  Labs Reviewed  COMPREHENSIVE METABOLIC PANEL - Abnormal; Notable for the following:    ALT 14 (*)    All other components within normal limits  RAPID INFLUENZA A&B ANTIGENS ([facility] ONLY)  LIPASE, BLOOD  CBC  TROPONIN I     ____________________________________________   EKG  I, Phineas Semen, attending physician, personally viewed and interpreted this EKG  EKG Time: 2020 Rate: 70 Rhythm: normal sinus rhythm Axis: normal Intervals: qtc 435 QRS: narrow ST changes: no st elevation Impression: normal ekg ____________________________________________    RADIOLOGY  None  ____________________________________________   PROCEDURES  Procedure(s) performed: None  Critical Care performed: No  ____________________________________________   INITIAL IMPRESSION / ASSESSMENT AND PLAN / ED COURSE  Pertinent labs & imaging results that were available during my care of the patient were reviewed by me and considered in my medical decision making (see chart for details).  Patient presented to the emergency department today because of concerns for some bloody emesis, emesis and abdominal pain. Patient has chronic history of abdominal pain and emesis. I think the bloody emesis was likely due to Mallory-Weiss tear. Patient certainly does not have any significant distress nor shortness of breath or chest pain that be suggestive of Boerhaave's. Blood work without any concerning  findings. Will plan on having patient follow up with GI.  ____________________________________________   FINAL CLINICAL IMPRESSION(S) / ED DIAGNOSES  Final diagnoses:  Nausea and vomiting, vomiting of unspecified type  Pain of upper abdomen     Phineas Semen, MD 05/30/15 2324

## 2015-06-07 ENCOUNTER — Other Ambulatory Visit: Payer: Self-pay | Admitting: Gastroenterology

## 2015-06-07 DIAGNOSIS — R634 Abnormal weight loss: Secondary | ICD-10-CM

## 2015-06-07 DIAGNOSIS — R112 Nausea with vomiting, unspecified: Secondary | ICD-10-CM

## 2015-06-14 DIAGNOSIS — R112 Nausea with vomiting, unspecified: Secondary | ICD-10-CM | POA: Insufficient documentation

## 2015-06-15 ENCOUNTER — Ambulatory Visit: Admission: RE | Admit: 2015-06-15 | Payer: Medicare Other | Source: Ambulatory Visit

## 2015-07-17 DIAGNOSIS — Z885 Allergy status to narcotic agent status: Secondary | ICD-10-CM | POA: Diagnosis not present

## 2015-07-17 DIAGNOSIS — M25551 Pain in right hip: Secondary | ICD-10-CM | POA: Diagnosis not present

## 2015-07-17 DIAGNOSIS — F17210 Nicotine dependence, cigarettes, uncomplicated: Secondary | ICD-10-CM | POA: Diagnosis not present

## 2015-07-17 DIAGNOSIS — K219 Gastro-esophageal reflux disease without esophagitis: Secondary | ICD-10-CM | POA: Diagnosis not present

## 2015-07-17 DIAGNOSIS — M542 Cervicalgia: Secondary | ICD-10-CM | POA: Diagnosis not present

## 2015-07-17 DIAGNOSIS — F209 Schizophrenia, unspecified: Secondary | ICD-10-CM | POA: Diagnosis not present

## 2015-07-17 DIAGNOSIS — Z79899 Other long term (current) drug therapy: Secondary | ICD-10-CM | POA: Diagnosis not present

## 2015-07-17 DIAGNOSIS — M545 Low back pain: Secondary | ICD-10-CM | POA: Diagnosis not present

## 2015-07-17 DIAGNOSIS — I10 Essential (primary) hypertension: Secondary | ICD-10-CM | POA: Diagnosis not present

## 2015-07-20 ENCOUNTER — Ambulatory Visit (INDEPENDENT_AMBULATORY_CARE_PROVIDER_SITE_OTHER): Payer: Medicare Other | Admitting: Internal Medicine

## 2015-07-20 ENCOUNTER — Encounter: Payer: Self-pay | Admitting: Internal Medicine

## 2015-07-20 VITALS — BP 124/84 | HR 72 | Ht 66.0 in | Wt 139.2 lb

## 2015-07-20 DIAGNOSIS — F200 Paranoid schizophrenia: Secondary | ICD-10-CM | POA: Diagnosis not present

## 2015-07-20 DIAGNOSIS — F410 Panic disorder [episodic paroxysmal anxiety] without agoraphobia: Secondary | ICD-10-CM | POA: Diagnosis not present

## 2015-07-20 DIAGNOSIS — R634 Abnormal weight loss: Secondary | ICD-10-CM | POA: Diagnosis not present

## 2015-07-20 DIAGNOSIS — K277 Chronic peptic ulcer, site unspecified, without hemorrhage or perforation: Secondary | ICD-10-CM | POA: Diagnosis not present

## 2015-07-20 MED ORDER — SUCRALFATE 1 G PO TABS: 1.0000 g | ORAL_TABLET | Freq: Four times a day (QID) | ORAL | Status: DC

## 2015-07-20 MED ORDER — CITALOPRAM HYDROBROMIDE 20 MG PO TABS: 20.0000 mg | ORAL_TABLET | Freq: Every day | ORAL | Status: DC

## 2015-07-20 MED ORDER — PANTOPRAZOLE SODIUM 40 MG PO TBEC: 40.0000 mg | DELAYED_RELEASE_TABLET | Freq: Every day | ORAL | Status: DC

## 2015-07-20 NOTE — Progress Notes (Signed)
Date:  07/20/2015   Name:  Lucas Guerra   DOB:  12/22/1985   MRN:  [id number]   Chief Complaint: Emesis; Oral Pain; and Schizophrenia Emesis  This is a chronic problem. The problem occurs 2 to 4 times per day. The problem has been gradually worsening. The emesis has an appearance of stomach contents and bile. There has been no fever. Associated symptoms include chest pain and weight loss. Pertinent negatives include no abdominal pain, chills, coughing, dizziness or fever. Associated symptoms comments: Also feels like food sticks at his lower esophagus.  He has lost 9 lbs in 8 months but almost 50 lbs since 11/2013.. Treatments tried: he has suffered from chronic emesis for years.  He was seen in the past by Dr. Bluford Kaufmann.   The treatment provided no relief.  Oral Pain  This is a new problem. Episode onset: seen by dentist.  Now being referred to a specialist - appt 5/1/`7. Pertinent negatives include no fever.   Panic attacks / hx schizophenria - He states that he was dismissed from [facility] in [place] for failure to show to a number of appointments. He says his last visit there was 2 months ago and he got a prescription for a one-month supply of medication. Therefore he has been out for the past 30 days. He states that schizophrenia is not a primary diagnosis any longer, he mainly suffers from panic attacks. He needs a referral to another psychiatrist, preferably in [place] for better access.   Review of Systems  Constitutional: Positive for weight loss and unexpected weight change. Negative for fever, chills and fatigue.  HENT: Positive for dental problem.   Respiratory: Positive for chest tightness and shortness of breath. Negative for cough and wheezing.   Cardiovascular: Positive for chest pain. Negative for palpitations and leg swelling.  Gastrointestinal: Positive for vomiting. Negative for abdominal pain, blood in stool and abdominal distention.  Neurological: Negative  for dizziness and syncope.  Psychiatric/Behavioral: Positive for dysphoric mood. Negative for hallucinations. The patient is nervous/anxious.     Patient Active Problem List   Diagnosis Date Noted  . 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 Paranoid schizophrenia (HCC) 11/02/2014  . Acid reflux 11/02/2014  . Chronic peptic ulcer without hemorrhage or perforation 11/02/2014  . Dysphagia 11/02/2014  . Disorder of mitral valve 11/02/2014  . Back ache 11/02/2014  . BP (high blood pressure) 11/02/2014    Prior to Admission medications   Medication Sig Start Date End Date Taking? Authorizing Provider  ALPRAZolam Prudy Feeler) 1 MG tablet Take 1 mg by mouth 2 (two) times daily as needed for anxiety.    Yes Historical Provider, MD  citalopram (CELEXA) 20 MG tablet Take 20 mg by mouth daily.   Yes Historical Provider, MD  doxepin (SINEQUAN) 10 MG capsule Take 1 capsule by mouth at bedtime as needed (for sleep.).  10/24/14  Yes Historical Provider, MD  naproxen (NAPROSYN) 500 MG tablet Take 500 mg by mouth. 07/17/15 07/24/15 Yes Historical Provider, MD  ondansetron (ZOFRAN) 4 MG tablet Take 1 tablet (4 mg total) by mouth every 8 (eight) hours as needed for nausea or vomiting. 05/30/15  Yes Phineas Semen, MD  pantoprazole (PROTONIX) 40 MG tablet Take 1 tablet (40 mg total) by mouth daily. 05/30/15 05/29/16 Yes Phineas Semen, MD  sucralfate (CARAFATE) 1 g tablet Take 1 tablet (1 g total) by mouth 4 (four) times daily. 05/30/15  Yes Phineas Semen, MD    Allergies  Allergen Reactions  .  Tramadol Rash    Past Surgical History  Procedure Laterality Date  . No past surgeries      Social History  Substance Use Topics  . Smoking status: Current Every Day Smoker -- 1.00 packs/day for 12 years    Types: Cigarettes  . Smokeless tobacco: Never Used  . Alcohol Use: 0.0 oz/week    0 Standard drinks or equivalent per week     Comment: rare consumption   Medication list has been reviewed and updated.  Physical  Exam  Constitutional: He is oriented to person, place, and time. He appears well-developed and well-nourished. No distress.  HENT:  Head: Normocephalic and atraumatic.  Neck: Normal range of motion. Neck supple. No thyromegaly present.  Cardiovascular: Normal rate, regular rhythm and normal heart sounds.   Pulmonary/Chest: Effort normal and breath sounds normal. No respiratory distress. He has no decreased breath sounds. He has no wheezes. He has no rhonchi.  Abdominal: Soft. Normal appearance and bowel sounds are normal. There is no hepatosplenomegaly. There is tenderness in the epigastric area. There is no CVA tenderness.  Musculoskeletal: Normal range of motion.  Lymphadenopathy:    He has no cervical adenopathy.  Neurological: He is alert and oriented to person, place, and time.  Skin: Skin is warm, dry and intact. No rash noted.  Psychiatric: His behavior is normal. His mood appears anxious. His speech is not rapid and/or pressured. He is not agitated.    BP 124/84 mmHg  Pulse 72  Ht 5\' 6"  (1.676 m)  Wt 139 lb 3.2 oz (63.141 kg)  BMI 22.48 kg/m2  Assessment and Plan: 1. Chronic peptic ulcer without hemorrhage or perforation - pantoprazole (PROTONIX) 40 MG tablet; Take 1 tablet (40 mg total) by mouth daily.  Dispense: 30 tablet; Refill: 1 - sucralfate (CARAFATE) 1 g tablet; Take 1 tablet (1 g total) by mouth 4 (four) times daily.  Dispense: 60 tablet; Refill: 0 - Ambulatory referral to Gastroenterology  2. Paranoid schizophrenia (HCC) - Ambulatory referral to Psychiatry  3. Panic disorder Will refill celexa once until seen by Psychiatry - citalopram (CELEXA) 20 MG tablet; Take 1 tablet (20 mg total) by mouth daily.  Dispense: 30 tablet; Refill: 0 - Ambulatory referral to Psychiatry  4. Excessive weight loss GI to evaluate   Bari Edward, MD [facility]  07/20/2015

## 2015-08-07 ENCOUNTER — Ambulatory Visit: Payer: Medicare Other | Admitting: Internal Medicine

## 2015-08-07 ENCOUNTER — Encounter: Payer: Self-pay | Admitting: Internal Medicine

## 2015-08-08 ENCOUNTER — Ambulatory Visit: Payer: Medicare Other | Admitting: Internal Medicine

## 2015-08-08 DIAGNOSIS — H04123 Dry eye syndrome of bilateral lacrimal glands: Secondary | ICD-10-CM | POA: Diagnosis not present

## 2015-08-19 ENCOUNTER — Emergency Department: Payer: Medicare Other

## 2015-08-19 ENCOUNTER — Encounter
Admission: EM | Disposition: A | Discharge status: Home / Self Care | Payer: Self-pay | Source: Home / Self Care | Attending: Emergency Medicine

## 2015-08-19 ENCOUNTER — Emergency Department
Admission: EM | Admit: 2015-08-19 | Discharge: 2015-08-19 | Disposition: A | Discharge status: Home / Self Care | Payer: Medicare Other | Attending: Surgery | Admitting: Surgery

## 2015-08-19 ENCOUNTER — Emergency Department: Payer: Medicare Other | Admitting: Anesthesiology

## 2015-08-19 ENCOUNTER — Encounter: Payer: Self-pay | Admitting: Emergency Medicine

## 2015-08-19 DIAGNOSIS — F17210 Nicotine dependence, cigarettes, uncomplicated: Secondary | ICD-10-CM | POA: Diagnosis not present

## 2015-08-19 DIAGNOSIS — F200 Paranoid schizophrenia: Secondary | ICD-10-CM | POA: Insufficient documentation

## 2015-08-19 DIAGNOSIS — F419 Anxiety disorder, unspecified: Secondary | ICD-10-CM | POA: Diagnosis not present

## 2015-08-19 DIAGNOSIS — K219 Gastro-esophageal reflux disease without esophagitis: Secondary | ICD-10-CM | POA: Insufficient documentation

## 2015-08-19 DIAGNOSIS — Y939 Activity, unspecified: Secondary | ICD-10-CM | POA: Diagnosis not present

## 2015-08-19 DIAGNOSIS — R1310 Dysphagia, unspecified: Secondary | ICD-10-CM | POA: Insufficient documentation

## 2015-08-19 DIAGNOSIS — S82842A Displaced bimalleolar fracture of left lower leg, initial encounter for closed fracture: Secondary | ICD-10-CM | POA: Diagnosis not present

## 2015-08-19 DIAGNOSIS — Z8249 Family history of ischemic heart disease and other diseases of the circulatory system: Secondary | ICD-10-CM | POA: Insufficient documentation

## 2015-08-19 DIAGNOSIS — Z833 Family history of diabetes mellitus: Secondary | ICD-10-CM | POA: Diagnosis not present

## 2015-08-19 DIAGNOSIS — S82892A Other fracture of left lower leg, initial encounter for closed fracture: Secondary | ICD-10-CM

## 2015-08-19 DIAGNOSIS — K277 Chronic peptic ulcer, site unspecified, without hemorrhage or perforation: Secondary | ICD-10-CM | POA: Diagnosis not present

## 2015-08-19 DIAGNOSIS — I10 Essential (primary) hypertension: Secondary | ICD-10-CM | POA: Insufficient documentation

## 2015-08-19 DIAGNOSIS — I059 Rheumatic mitral valve disease, unspecified: Secondary | ICD-10-CM | POA: Diagnosis not present

## 2015-08-19 DIAGNOSIS — M25572 Pain in left ankle and joints of left foot: Secondary | ICD-10-CM | POA: Diagnosis not present

## 2015-08-19 DIAGNOSIS — S82852A Displaced trimalleolar fracture of left lower leg, initial encounter for closed fracture: Secondary | ICD-10-CM

## 2015-08-19 DIAGNOSIS — B0089 Other herpesviral infection: Secondary | ICD-10-CM | POA: Insufficient documentation

## 2015-08-19 DIAGNOSIS — Z888 Allergy status to other drugs, medicaments and biological substances status: Secondary | ICD-10-CM | POA: Insufficient documentation

## 2015-08-19 DIAGNOSIS — R011 Cardiac murmur, unspecified: Secondary | ICD-10-CM | POA: Diagnosis not present

## 2015-08-19 DIAGNOSIS — S82832A Other fracture of upper and lower end of left fibula, initial encounter for closed fracture: Secondary | ICD-10-CM | POA: Diagnosis not present

## 2015-08-19 HISTORY — PX: ORIF ANKLE FRACTURE: SHX5408

## 2015-08-19 HISTORY — PX: ORIF ANKLE FRACTURE: SUR919

## 2015-08-19 SURGERY — OPEN REDUCTION INTERNAL FIXATION (ORIF) ANKLE FRACTURE
Anesthesia: General | Laterality: Left | Wound class: Clean

## 2015-08-19 MED ORDER — HALOPERIDOL LACTATE 5 MG/ML IJ SOLN
1.0000 mg | Freq: Four times a day (QID) | INTRAMUSCULAR | Status: AC | PRN
  Administered 2015-08-19: 1 mg via INTRAVENOUS
  Filled 2015-08-19: qty 0.2

## 2015-08-19 MED ORDER — ONDANSETRON HCL 4 MG/2ML IJ SOLN
INTRAMUSCULAR | Status: AC
  Administered 2015-08-19: 4 mg via INTRAVENOUS
  Filled 2015-08-19: qty 2

## 2015-08-19 MED ORDER — POTASSIUM CHLORIDE IN NACL 20-0.9 MEQ/L-% IV SOLN: INTRAVENOUS | Status: DC

## 2015-08-19 MED ORDER — HYDROMORPHONE HCL 1 MG/ML IJ SOLN
INTRAMUSCULAR | Status: AC
  Administered 2015-08-19: 1 mg via INTRAVENOUS
  Filled 2015-08-19: qty 1

## 2015-08-19 MED ORDER — BUPIVACAINE HCL 0.5 % IJ SOLN
INTRAMUSCULAR | Status: DC | PRN
  Administered 2015-08-19: 20 mL

## 2015-08-19 MED ORDER — ACETAMINOPHEN 10 MG/ML IV SOLN
INTRAVENOUS | Status: DC | PRN
  Administered 2015-08-19: 1000 mg via INTRAVENOUS

## 2015-08-19 MED ORDER — HYDROMORPHONE HCL 1 MG/ML IJ SOLN
1.0000 mg | Freq: Once | INTRAMUSCULAR | Status: AC
Start: 2015-08-19 — End: 2015-08-19
  Administered 2015-08-19: 1 mg via INTRAVENOUS

## 2015-08-19 MED ORDER — ACETAMINOPHEN 10 MG/ML IV SOLN
INTRAVENOUS | Status: AC
  Filled 2015-08-19: qty 100

## 2015-08-19 MED ORDER — CEFAZOLIN SODIUM-DEXTROSE 2-4 GM/100ML-% IV SOLN
2.0000 g | INTRAVENOUS | Status: AC
  Administered 2015-08-19: 2 g via INTRAVENOUS
  Filled 2015-08-19: qty 100

## 2015-08-19 MED ORDER — HYDROMORPHONE HCL 1 MG/ML IJ SOLN
INTRAMUSCULAR | Status: AC
  Filled 2015-08-19: qty 1

## 2015-08-19 MED ORDER — OXYCODONE HCL 5 MG/5ML PO SOLN: 5.0000 mg | Freq: Once | ORAL | Status: DC | PRN

## 2015-08-19 MED ORDER — BUPIVACAINE HCL (PF) 0.5 % IJ SOLN
INTRAMUSCULAR | Status: AC
  Filled 2015-08-19: qty 30

## 2015-08-19 MED ORDER — ONDANSETRON HCL 4 MG PO TABS: 4.0000 mg | ORAL_TABLET | Freq: Four times a day (QID) | ORAL | Status: DC | PRN

## 2015-08-19 MED ORDER — MIDAZOLAM HCL 2 MG/2ML IJ SOLN
INTRAMUSCULAR | Status: DC | PRN
  Administered 2015-08-19: 2 mg via INTRAVENOUS

## 2015-08-19 MED ORDER — HALOPERIDOL LACTATE 5 MG/ML IJ SOLN
INTRAMUSCULAR | Status: AC
  Filled 2015-08-19: qty 1

## 2015-08-19 MED ORDER — FENTANYL CITRATE (PF) 100 MCG/2ML IJ SOLN: 25.0000 ug | INTRAMUSCULAR | Status: DC | PRN

## 2015-08-19 MED ORDER — HYDROMORPHONE HCL 1 MG/ML IJ SOLN
0.2500 mg | INTRAMUSCULAR | Status: DC | PRN
  Administered 2015-08-19 (×4): 0.25 mg via INTRAVENOUS

## 2015-08-19 MED ORDER — HYDROMORPHONE HCL 1 MG/ML IJ SOLN
1.0000 mg | Freq: Once | INTRAMUSCULAR | Status: AC
  Administered 2015-08-19: 1 mg via INTRAVENOUS

## 2015-08-19 MED ORDER — NEOMYCIN-POLYMYXIN B GU 40-200000 IR SOLN
Status: DC | PRN
  Administered 2015-08-19: 4 mL

## 2015-08-19 MED ORDER — SUCCINYLCHOLINE CHLORIDE 20 MG/ML IJ SOLN
INTRAMUSCULAR | Status: DC | PRN
  Administered 2015-08-19: 100 mg via INTRAVENOUS

## 2015-08-19 MED ORDER — METOCLOPRAMIDE HCL 10 MG PO TABS: 5.0000 mg | ORAL_TABLET | Freq: Three times a day (TID) | ORAL | Status: DC | PRN

## 2015-08-19 MED ORDER — SODIUM CHLORIDE FLUSH 0.9 % IV SOLN
INTRAVENOUS | Status: AC
  Filled 2015-08-19: qty 3

## 2015-08-19 MED ORDER — OXYCODONE HCL 5 MG PO TABS: 5.0000 mg | ORAL_TABLET | Freq: Once | ORAL | Status: DC | PRN

## 2015-08-19 MED ORDER — LACTATED RINGERS IV SOLN
INTRAVENOUS | Status: DC | PRN
  Administered 2015-08-19: 15:00:00 via INTRAVENOUS

## 2015-08-19 MED ORDER — PROPOFOL 10 MG/ML IV BOLUS
INTRAVENOUS | Status: DC | PRN
  Administered 2015-08-19: 200 mg via INTRAVENOUS

## 2015-08-19 MED ORDER — ONDANSETRON HCL 4 MG/2ML IJ SOLN: 4.0000 mg | Freq: Four times a day (QID) | INTRAMUSCULAR | Status: DC | PRN

## 2015-08-19 MED ORDER — ONDANSETRON HCL 4 MG/2ML IJ SOLN
4.0000 mg | Freq: Once | INTRAMUSCULAR | Status: AC
  Administered 2015-08-19: 4 mg via INTRAVENOUS

## 2015-08-19 MED ORDER — FENTANYL CITRATE (PF) 100 MCG/2ML IJ SOLN
INTRAMUSCULAR | Status: DC | PRN
  Administered 2015-08-19: 100 ug via INTRAVENOUS
  Administered 2015-08-19 (×2): 50 ug via INTRAVENOUS

## 2015-08-19 MED ORDER — OXYCODONE HCL 5 MG PO TABS: 5.0000 mg | ORAL_TABLET | ORAL | Status: DC | PRN

## 2015-08-19 MED ORDER — LIDOCAINE HCL (CARDIAC) 20 MG/ML IV SOLN
INTRAVENOUS | Status: DC | PRN
  Administered 2015-08-19: 50 mg via INTRAVENOUS

## 2015-08-19 MED ORDER — METOCLOPRAMIDE HCL 5 MG/ML IJ SOLN: 5.0000 mg | Freq: Three times a day (TID) | INTRAMUSCULAR | Status: DC | PRN

## 2015-08-19 MED ORDER — NEOMYCIN-POLYMYXIN B GU 40-200000 IR SOLN
Status: AC
  Filled 2015-08-19: qty 4

## 2015-08-19 MED ORDER — ROCURONIUM BROMIDE 100 MG/10ML IV SOLN
INTRAVENOUS | Status: DC | PRN
  Administered 2015-08-19: 30 mg via INTRAVENOUS

## 2015-08-19 MED ORDER — DEXAMETHASONE SODIUM PHOSPHATE 10 MG/ML IJ SOLN
INTRAMUSCULAR | Status: DC | PRN
  Administered 2015-08-19: 5 mg via INTRAVENOUS

## 2015-08-19 SURGICAL SUPPLY — 63 items
BANDAGE ACE 4X5 VEL STRL LF (GAUZE/BANDAGES/DRESSINGS) ×6 IMPLANT
BANDAGE ACE 6X5 VEL STRL LF (GAUZE/BANDAGES/DRESSINGS) ×3 IMPLANT
BIT DRILL 2.5X2.75 QC CALB (BIT) ×3 IMPLANT
BIT DRILL 2.9 CANN QC NONSTRL (BIT) ×3 IMPLANT
BIT DRILL CALIBRATED 2.7 (BIT) ×2 IMPLANT
BIT DRILL CALIBRATED 2.7MM (BIT) ×1
BLADE SURG SZ10 CARB STEEL (BLADE) ×6 IMPLANT
BNDG COHESIVE 4X5 TAN STRL (GAUZE/BANDAGES/DRESSINGS) ×3 IMPLANT
BNDG COHESIVE 4X5 WHT NS (GAUZE/BANDAGES/DRESSINGS) ×3 IMPLANT
BNDG ESMARK 6X12 TAN STRL LF (GAUZE/BANDAGES/DRESSINGS) ×3 IMPLANT
BNDG PLASTER FAST 4X5 WHT LF (CAST SUPPLIES) ×3 IMPLANT
CANISTER SUCT 1200ML W/VALVE (MISCELLANEOUS) ×3 IMPLANT
CHLORAPREP W/TINT 26ML (MISCELLANEOUS) ×6 IMPLANT
CUFF TOURN 18 STER (MISCELLANEOUS) ×3 IMPLANT
CUFF TOURN 24 STER (MISCELLANEOUS) IMPLANT
CUFF TOURN 30 STER DUAL PORT (MISCELLANEOUS) IMPLANT
CUFF TOURN DUAL PL 12 NO SLV (MISCELLANEOUS) IMPLANT
DRAPE C-ARM XRAY 36X54 (DRAPES) ×3 IMPLANT
DRAPE C-ARMOR (DRAPES) ×3 IMPLANT
DRAPE INCISE IOBAN 66X45 STRL (DRAPES) ×3 IMPLANT
DRAPE U-SHAPE 47X51 STRL (DRAPES) ×3 IMPLANT
DRILL SLEEVE 2.7 DIST TIB (TRAUMA) ×2
ELECT CAUTERY BLADE 6.4 (BLADE) ×3 IMPLANT
ELECT REM PT RETURN 9FT ADLT (ELECTROSURGICAL) ×3
ELECTRODE REM PT RTRN 9FT ADLT (ELECTROSURGICAL) ×1 IMPLANT
GAUZE PETRO XEROFOAM 1X8 (MISCELLANEOUS) ×3 IMPLANT
GAUZE SPONGE 4X4 12PLY STRL (GAUZE/BANDAGES/DRESSINGS) ×3 IMPLANT
GLOVE BIO SURGEON STRL SZ8 (GLOVE) ×12 IMPLANT
GLOVE INDICATOR 8.0 STRL GRN (GLOVE) ×6 IMPLANT
GOWN STRL REUS W/ TWL LRG LVL3 (GOWN DISPOSABLE) ×1 IMPLANT
GOWN STRL REUS W/ TWL XL LVL3 (GOWN DISPOSABLE) ×1 IMPLANT
GOWN STRL REUS W/TWL LRG LVL3 (GOWN DISPOSABLE) ×2
GOWN STRL REUS W/TWL XL LVL3 (GOWN DISPOSABLE) ×2
HEMOVAC 400ML (MISCELLANEOUS)
K-WIRE ACE 1.6X6 (WIRE) ×12
KIT DRAIN HEMOVAC JP 7FR 400ML (MISCELLANEOUS) IMPLANT
KIT RM TURNOVER STRD PROC AR (KITS) ×3 IMPLANT
KWIRE ACE 1.6X6 (WIRE) ×4 IMPLANT
LABEL OR SOLS (LABEL) ×3 IMPLANT
NS IRRIG 1000ML POUR BTL (IV SOLUTION) ×3 IMPLANT
PACK EXTREMITY ARMC (MISCELLANEOUS) ×3 IMPLANT
PAD ABD DERMACEA PRESS 5X9 (GAUZE/BANDAGES/DRESSINGS) ×3 IMPLANT
PAD CAST CTTN 4X4 STRL (SOFTGOODS) ×1 IMPLANT
PAD PREP 24X41 OB/GYN DISP (PERSONAL CARE ITEMS) ×3 IMPLANT
PADDING CAST COTTON 4X4 STRL (SOFTGOODS) ×2
PLATE LOCK 3H 95 LT DIST FIB (Plate) ×3 IMPLANT
SCREW ACE CAN 4.0 40M (Screw) ×6 IMPLANT
SCREW LOCK CORT STAR 3.5X14 (Screw) ×3 IMPLANT
SCREW LOCK CORT STAR 3.5X16 (Screw) ×9 IMPLANT
SCREW LOCK CORT STAR 3.5X18 (Screw) ×3 IMPLANT
SCREW LOW PROFILE 18MMX3.5MM (Screw) ×3 IMPLANT
SCREW LOW PROFILE 22MMX3.5MM (Screw) ×3 IMPLANT
SCREW NON LOCKING LP 3.5 16MM (Screw) ×6 IMPLANT
SLEEVE DRILL 2.7 DIST TIB (TRAUMA) ×1 IMPLANT
SPONGE LAP 18X18 5 PK (GAUZE/BANDAGES/DRESSINGS) ×3 IMPLANT
STAPLER SKIN PROX 35W (STAPLE) ×3 IMPLANT
STOCKINETTE M/LG 89821 (MISCELLANEOUS) ×3 IMPLANT
STOCKINETTE STRL 6IN 960660 (GAUZE/BANDAGES/DRESSINGS) ×3 IMPLANT
SUT PROLENE 4 0 PS 2 18 (SUTURE) ×3 IMPLANT
SUT VIC AB 0 CT1 36 (SUTURE) ×3 IMPLANT
SUT VIC AB 2-0 SH 27 (SUTURE) ×6
SUT VIC AB 2-0 SH 27XBRD (SUTURE) ×3 IMPLANT
SYRINGE 10CC LL (SYRINGE) ×3 IMPLANT
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
## 2015-08-19 NOTE — ED Notes (Signed)
During splint application patient became aggitated, and making gestures that he was going to leave if we didn't stop. MD was present at this time. Patient started counting down to 3 and stated "you don't want me to get to three"  Patient was not combative but verbally abusive when in extreme pain.  After splint applied patient given another 1mg  of Dilaudid.

## 2015-08-19 NOTE — Anesthesia Procedure Notes (Signed)
Procedure Name: Intubation Date/Time: 08/19/2015 3:15 PM Performed by: Sherron Flemings Pre-anesthesia Checklist: Patient identified, Patient being monitored, Timeout performed, Emergency Drugs available and Suction available Patient Re-evaluated:Patient Re-evaluated prior to inductionOxygen Delivery Method: Circle system utilized Preoxygenation: Pre-oxygenation with 100% oxygen Intubation Type: IV induction Ventilation: Mask ventilation without difficulty Laryngoscope Size: Mac and 3 Grade View: Grade I Tube type: Oral Tube size: 7.5 mm Number of attempts: 1 Airway Equipment and Method: Stylet Placement Confirmation: ETT inserted through vocal cords under direct vision,  positive ETCO2 and breath sounds checked- equal and bilateral Secured at: 21 cm Tube secured with: Tape Dental Injury: Teeth and Oropharynx as per pre-operative assessment
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
## 2015-08-19 NOTE — Anesthesia Postprocedure Evaluation (Signed)
Anesthesia Post Note  Patient: Lucas Guerra  Procedure(s) Performed: Procedure(s) (LRB): OPEN REDUCTION INTERNAL FIXATION (ORIF) ANKLE FRACTURE (Left)  Patient location during evaluation: PACU Anesthesia Type: General Level of consciousness: awake and alert Pain management: pain level controlled Vital Signs Assessment: post-procedure vital signs reviewed and stable Respiratory status: spontaneous breathing, nonlabored ventilation, respiratory function stable and patient connected to nasal cannula oxygen Cardiovascular status: blood pressure returned to baseline and stable Postop Assessment: no signs of nausea or vomiting Anesthetic complications: no    Last Vitals:  Filed Vitals:   08/19/15 1853 08/19/15 1856  BP:  122/86  Pulse: 57 57  Temp:  36.7 C  Resp: 8 13    Last Pain:  Filed Vitals:   08/19/15 1858  PainSc: 7                  Cleda Mccreedy Lajada Janes

## 2015-08-19 NOTE — ED Notes (Signed)
XR in room 
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
## 2015-08-19 NOTE — Discharge Instructions (Addendum)
Keep splint dry and intact.  Apply ice frequently to ankle. No weight-bearing on left foot - use crutches or walker. Follow-up in 10-14 days or as scheduled  .AMBULATORY SURGERY  DISCHARGE INSTRUCTIONS   1) The drugs that you were given will stay in your system until tomorrow so for the next 24 hours you should not:  A) Drive an automobile B) Make any legal decisions C) Drink any alcoholic beverage   2) You may resume regular meals tomorrow.  Today it is better to start with liquids and gradually work up to solid foods.  You may eat anything you prefer, but it is better to start with liquids, then soup and crackers, and gradually work up to solid foods.   3) Please notify your doctor immediately if you have any unusual bleeding, trouble breathing, redness and pain at the surgery site, drainage, fever, or pain not relieved by medication.    4) Additional Instructions: Keep elevated above the level of heart       Apply ice to front of ankle   Please contact your physician with any problems or Same Day Surgery at [phone], Monday through Friday 6 am to 4 pm, or [place] at [facility] number at [phone].
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
## 2015-08-19 NOTE — OR Nursing (Signed)
Patient is very sleepy and refuses to stay patient wants to go home

## 2015-08-19 NOTE — Transfer of Care (Signed)
Immediate Anesthesia Transfer of Care Note  Patient: Lucas Guerra  Procedure(s) Performed: Procedure(s): OPEN REDUCTION INTERNAL FIXATION (ORIF) ANKLE FRACTURE (Left)  Patient Location: PACU  Anesthesia Type:Spinal  Level of Consciousness: awake, alert  and oriented  Airway & Oxygen Therapy: Patient Spontanous Breathing  Post-op Assessment: Report given to RN and Post -op Vital signs reviewed and stable  Post vital signs: Reviewed and stable  Last Vitals:  Filed Vitals:   08/19/15 1301 08/19/15 1330  BP:  126/94  Pulse: 51 59  Temp:    Resp:  18    Complications: No apparent anesthesia complications
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
## 2015-08-19 NOTE — ED Notes (Signed)
ED secretary spoke with OR re: admission orders for patient. Will place orders once MD is finished with current surgical case.

## 2015-08-20 ENCOUNTER — Encounter: Payer: Self-pay | Admitting: Internal Medicine

## 2015-08-22 ENCOUNTER — Encounter: Payer: Self-pay | Admitting: Surgery

## 2015-08-22 DIAGNOSIS — S82852A Displaced trimalleolar fracture of left lower leg, initial encounter for closed fracture: Secondary | ICD-10-CM | POA: Insufficient documentation

## 2015-08-28 DIAGNOSIS — Z8781 Personal history of (healed) traumatic fracture: Secondary | ICD-10-CM | POA: Diagnosis not present

## 2015-08-28 DIAGNOSIS — S82852D Displaced trimalleolar fracture of left lower leg, subsequent encounter for closed fracture with routine healing: Secondary | ICD-10-CM | POA: Diagnosis not present

## 2015-08-28 DIAGNOSIS — Z967 Presence of other bone and tendon implants: Secondary | ICD-10-CM | POA: Diagnosis not present

## 2015-08-31 DIAGNOSIS — Z967 Presence of other bone and tendon implants: Secondary | ICD-10-CM | POA: Diagnosis not present

## 2015-08-31 DIAGNOSIS — Z8781 Personal history of (healed) traumatic fracture: Secondary | ICD-10-CM | POA: Diagnosis not present

## 2015-08-31 DIAGNOSIS — S82852D Displaced trimalleolar fracture of left lower leg, subsequent encounter for closed fracture with routine healing: Secondary | ICD-10-CM | POA: Diagnosis not present

## 2015-09-20 ENCOUNTER — Other Ambulatory Visit: Payer: Self-pay | Admitting: Internal Medicine

## 2015-09-27 DIAGNOSIS — Z8781 Personal history of (healed) traumatic fracture: Secondary | ICD-10-CM | POA: Diagnosis not present

## 2015-09-27 DIAGNOSIS — Z967 Presence of other bone and tendon implants: Secondary | ICD-10-CM | POA: Diagnosis not present

## 2015-10-02 ENCOUNTER — Ambulatory Visit: Payer: Medicare Other | Admitting: Internal Medicine

## 2015-10-03 ENCOUNTER — Ambulatory Visit: Payer: Medicare Other | Admitting: Internal Medicine

## 2015-10-04 ENCOUNTER — Encounter (HOSPITAL_COMMUNITY): Payer: Self-pay | Admitting: Emergency Medicine

## 2015-10-04 ENCOUNTER — Emergency Department (HOSPITAL_COMMUNITY): Payer: Medicare Other

## 2015-10-04 ENCOUNTER — Emergency Department (HOSPITAL_COMMUNITY)
Admission: EM | Admit: 2015-10-04 | Discharge: 2015-10-04 | Disposition: A | Discharge status: Home / Self Care | Payer: Medicare Other | Attending: Emergency Medicine | Admitting: Emergency Medicine

## 2015-10-04 DIAGNOSIS — Y939 Activity, unspecified: Secondary | ICD-10-CM | POA: Diagnosis not present

## 2015-10-04 DIAGNOSIS — S99922A Unspecified injury of left foot, initial encounter: Secondary | ICD-10-CM | POA: Diagnosis not present

## 2015-10-04 DIAGNOSIS — Y999 Unspecified external cause status: Secondary | ICD-10-CM | POA: Diagnosis not present

## 2015-10-04 DIAGNOSIS — F17210 Nicotine dependence, cigarettes, uncomplicated: Secondary | ICD-10-CM | POA: Diagnosis not present

## 2015-10-04 DIAGNOSIS — Y929 Unspecified place or not applicable: Secondary | ICD-10-CM | POA: Diagnosis not present

## 2015-10-04 DIAGNOSIS — M25572 Pain in left ankle and joints of left foot: Secondary | ICD-10-CM

## 2015-10-04 DIAGNOSIS — X509XXA Other and unspecified overexertion or strenuous movements or postures, initial encounter: Secondary | ICD-10-CM | POA: Insufficient documentation

## 2015-10-04 DIAGNOSIS — M7918 Myalgia, other site: Secondary | ICD-10-CM

## 2015-10-04 DIAGNOSIS — S99912A Unspecified injury of left ankle, initial encounter: Secondary | ICD-10-CM | POA: Diagnosis not present

## 2015-10-04 DIAGNOSIS — M79672 Pain in left foot: Secondary | ICD-10-CM | POA: Diagnosis not present

## 2015-10-04 MED ORDER — OXYCODONE-ACETAMINOPHEN 5-325 MG PO TABS
1.0000 | ORAL_TABLET | Freq: Once | ORAL | Status: AC
Start: 2015-10-04 — End: 2015-10-04
  Administered 2015-10-04: 1 via ORAL
  Filled 2015-10-04: qty 1

## 2015-10-04 MED ORDER — NAPROXEN 500 MG PO TABS: 500.0000 mg | ORAL_TABLET | Freq: Two times a day (BID) | ORAL | Status: DC
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
## 2015-10-04 NOTE — ED Notes (Signed)
Pt presents to ED for assessment of left ankle pain.  Pt broke ankle/foot in "three places" x 2 month ago and was removed from the cast x 1 month ago, has been wearing a boot and using crutches.  Pt got tripped up trying to ambulate down the stairs and fall down the steps.  Pt is c/o increased pain with the left ankle/foot and is unable to wear the boot right now.  Pulses soft, but palpable.  Pt sts he is experiencing numbness and tingling.

## 2015-10-04 NOTE — ED Notes (Signed)
Patient able to ambulate independently with crutches 

## 2015-10-06 ENCOUNTER — Ambulatory Visit (INDEPENDENT_AMBULATORY_CARE_PROVIDER_SITE_OTHER): Payer: Medicare Other | Admitting: Internal Medicine

## 2015-10-06 ENCOUNTER — Encounter: Payer: Self-pay | Admitting: Internal Medicine

## 2015-10-06 VITALS — BP 128/86 | HR 88 | Resp 16 | Ht 66.0 in | Wt 142.8 lb

## 2015-10-06 DIAGNOSIS — S82852A Displaced trimalleolar fracture of left lower leg, initial encounter for closed fracture: Secondary | ICD-10-CM | POA: Diagnosis not present

## 2015-10-06 MED ORDER — GABAPENTIN 300 MG PO CAPS: 300.0000 mg | ORAL_CAPSULE | Freq: Three times a day (TID) | ORAL | Status: DC

## 2015-10-06 NOTE — Progress Notes (Signed)
Date:  10/06/2015   Name:  Lucas Guerra   DOB:  05/31/1985   MRN:  [id number]   Chief Complaint: Foot Injury Patient had tri-malleolar fracture of his ankle with ORIF about 6 weeks ago. Last week was taken out of cast and put in a Cam walker boot.  He was informed that they were tapering his oxycodone, despite on-going significant pain. He was told to take Ibuprofen 800 mg. He went to ER a few days ago after a fall.  Xrays showed hardware intact.  He was prescribed Naproxen. On 10/02/15 he was given Oxycodone #20 tablets as a final Rx from Orthopedics.  He has already taken them all.  He admits that he is not taking Ibuprofen regularly.   Review of Systems  Constitutional: Negative for fever, chills and fatigue.  Respiratory: Negative for cough and shortness of breath.   Cardiovascular: Negative for chest pain.  Musculoskeletal: Positive for joint swelling, arthralgias and gait problem.  Psychiatric/Behavioral: Positive for sleep disturbance.    Patient Active Problem List   Diagnosis Date Noted  . Closed displaced trimalleolar fracture of left lower leg 08/22/2015  . N&V (nausea and vomiting) 06/14/2015  . 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 Paranoid schizophrenia (HCC) 11/02/2014  . Acid reflux 11/02/2014  . Chronic peptic ulcer without hemorrhage or perforation 11/02/2014  . Dysphagia 11/02/2014  . Disorder of mitral valve 11/02/2014  . Back ache 11/02/2014  . BP (high blood pressure) 11/02/2014    Prior to Admission medications   Medication Sig Start Date End Date Taking? Authorizing Provider  ALPRAZolam Prudy Feeler) 1 MG tablet Take 1 mg by mouth 2 (two) times daily as needed for anxiety.    Yes Historical Provider, MD  citalopram (CELEXA) 20 MG tablet Take 1 tablet (20 mg total) by mouth daily. 07/20/15  Yes Reubin Milan, MD  doxepin (SINEQUAN) 10 MG capsule Take 1 capsule by mouth at bedtime as needed (for sleep.).  10/24/14  Yes Historical Provider, MD  ibuprofen  (ADVIL,MOTRIN) 800 MG tablet Take by mouth. 10/02/15  Yes Historical Provider, MD  naproxen (NAPROSYN) 500 MG tablet Take 1 tablet (500 mg total) by mouth 2 (two) times daily. 10/04/15  Yes Audry Pili, PA-C  ondansetron (ZOFRAN) 4 MG tablet Take 1 tablet (4 mg total) by mouth every 8 (eight) hours as needed for nausea or vomiting. 05/30/15  Yes Phineas Semen, MD  pantoprazole (PROTONIX) 40 MG tablet Take 1 tablet (40 mg total) by mouth daily. Patient taking differently: Take 40 mg by mouth daily as needed (for heartburn/indigestion.).  07/20/15 07/19/16 Yes Reubin Milan, MD  sucralfate (CARAFATE) 1 g tablet TAKE 1 TABLET (1 G TOTAL) BY MOUTH 4 (FOUR) TIMES DAILY. 09/21/15  Yes Reubin Milan, MD  oxyCODONE (ROXICODONE) 5 MG immediate release tablet Take 1-2 tablets (5-10 mg total) by mouth every 4 (four) hours as needed for severe pain. Patient not taking: Reported on 10/06/2015 08/19/15   Christena Flake, MD    Allergies  Allergen Reactions  . Tramadol Rash    Past Surgical History  Procedure Laterality Date  . Orif ankle fracture Right 08/19/15  . Orif ankle fracture Left 08/19/2015    Procedure: OPEN REDUCTION INTERNAL FIXATION (ORIF) ANKLE FRACTURE;  Surgeon: Christena Flake, MD;  Location: [facility] ORS;  Service: Orthopedics;  Laterality: Left;    Social History  Substance Use Topics  . Smoking status: Current Every Day Smoker -- 1.50 packs/day for 12 years  Types: Cigarettes  . Smokeless tobacco: Never Used  . Alcohol Use: 0.0 oz/week    0 Standard drinks or equivalent per week     Comment: rare consumption     Medication list has been reviewed and updated.   Physical Exam  Constitutional: He is oriented to person, place, and time. He appears well-developed. No distress.  HENT:  Head: Normocephalic and atraumatic.  Pulmonary/Chest: Effort normal. No respiratory distress.  Musculoskeletal: Normal range of motion.  Well healed incisions anterior and lateral left ankle.  Moderate  soft tissue swelling. Sensation intact. Minimal flexion with significant pain. Calf soft.  Neurological: He is alert and oriented to person, place, and time.  Skin: Skin is warm and dry. No rash noted.  Psychiatric: He has a normal mood and affect. His behavior is normal. Thought content normal.  Nursing note and vitals reviewed.   BP 128/86 mmHg  Pulse 88  Resp 16  Ht 5\' 6"  (1.676 m)  Wt 142 lb 12.8 oz (64.774 kg)  BMI 23.06 kg/m2  SpO2 99%  Assessment and Plan: 1. Closed displaced trimalleolar fracture of left lower leg, initial encounter Patient instructed to take Advil 800 mg tid and begin Gabapentin 300 mg every 8 hours around the clock. He is instructed to follow up with Orthopedics in 6 weeks. - Ambulatory referral to Pain Clinic - gabapentin (NEURONTIN) 300 MG capsule; Take 1 capsule (300 mg total) by mouth 3 (three) times daily.  Dispense: 90 capsule; Refill: 3   Bari Edward, MD [facility]  10/06/2015

## 2015-10-11 ENCOUNTER — Other Ambulatory Visit: Payer: Self-pay | Admitting: Internal Medicine

## 2015-10-18 ENCOUNTER — Encounter: Payer: Self-pay | Admitting: *Deleted

## 2015-10-18 ENCOUNTER — Ambulatory Visit: Payer: Medicare Other

## 2015-10-18 ENCOUNTER — Ambulatory Visit
Admission: EM | Admit: 2015-10-18 | Discharge: 2015-10-18 | Disposition: A | Discharge status: Home / Self Care | Payer: Medicare Other | Attending: Family Medicine | Admitting: Family Medicine

## 2015-10-18 DIAGNOSIS — S82401G Unspecified fracture of shaft of right fibula, subsequent encounter for closed fracture with delayed healing: Secondary | ICD-10-CM

## 2015-10-18 DIAGNOSIS — M792 Neuralgia and neuritis, unspecified: Secondary | ICD-10-CM

## 2015-10-18 DIAGNOSIS — F419 Anxiety disorder, unspecified: Secondary | ICD-10-CM | POA: Diagnosis not present

## 2015-10-18 DIAGNOSIS — K219 Gastro-esophageal reflux disease without esophagitis: Secondary | ICD-10-CM | POA: Diagnosis not present

## 2015-10-18 DIAGNOSIS — S8262XA Displaced fracture of lateral malleolus of left fibula, initial encounter for closed fracture: Secondary | ICD-10-CM | POA: Diagnosis not present

## 2015-10-18 DIAGNOSIS — W19XXXD Unspecified fall, subsequent encounter: Secondary | ICD-10-CM | POA: Diagnosis not present

## 2015-10-18 DIAGNOSIS — S8252XD Displaced fracture of medial malleolus of left tibia, subsequent encounter for closed fracture with routine healing: Secondary | ICD-10-CM | POA: Diagnosis not present

## 2015-10-18 DIAGNOSIS — F17210 Nicotine dependence, cigarettes, uncomplicated: Secondary | ICD-10-CM | POA: Diagnosis not present

## 2015-10-18 DIAGNOSIS — M25572 Pain in left ankle and joints of left foot: Secondary | ICD-10-CM | POA: Diagnosis present

## 2015-10-18 MED ORDER — ACETAMINOPHEN 500 MG PO TABS: 1000.0000 mg | ORAL_TABLET | Freq: Four times a day (QID) | ORAL | 0 refills | Status: DC | PRN

## 2015-10-18 NOTE — ED Triage Notes (Signed)
Pt had left ankle fx 2-3 months ago and surgery to repair it at [facility] approx 2 months ago. Pt here for persistent pain and difficulty bearing weight. Pt is in air boot and on crutches.

## 2015-10-18 NOTE — ED Provider Notes (Signed)
CSN: [id number]     Arrival date & time 10/18/15  1424 History   First MD Initiated Contact with Patient 10/18/15 1505     Chief Complaint  Patient presents with  . Ankle Pain   (Consider location/radiation/quality/duration/timing/severity/associated sxs/prior Treatment) Single african [place] male here for evaluation left ankle/foot pain s/p falls x 2 this past month  "foot is pulsing and throbbing and toes tingling"  Patient denied completing course of PT s/p surgery.  Last saw [facility] mid July and was started on gabapentin and motrin and referred to pain management as orthopedics recommended discontinuation of oxycodone last fill ER provider 20 pills earlier this month after first fall.  Patient in cam boot, s/p ORIF [facility] Horris Latino still using crutches has tried ice and elevation and meds as prescribed BID dosing.  Pain management appt 8 Aug at [facility] per patient  Currently unemployed   The history is provided by the patient.    Past Medical History:  Diagnosis Date  . Anxiety   . GERD (gastroesophageal reflux disease)   . Heart murmur    Past Surgical History:  Procedure Laterality Date  . ORIF ANKLE FRACTURE Right 08/19/15  . ORIF ANKLE FRACTURE Left 08/19/2015   Procedure: OPEN REDUCTION INTERNAL FIXATION (ORIF) ANKLE FRACTURE;  Surgeon: Christena Flake, MD;  Location: [facility] ORS;  Service: Orthopedics;  Laterality: Left;   Family History  Problem Relation Age of Onset  . Hypertension Mother   . Diabetes Father    Social History  Substance Use Topics  . Smoking status: Current Every Day Smoker    Packs/day: 1.50    Years: 12.00    Types: Cigarettes  . Smokeless tobacco: Never Used  . Alcohol use No     Comment: rare consumption    Review of Systems  Constitutional: Negative for activity change, appetite change, chills, diaphoresis, fatigue and fever.  HENT: Negative for congestion, mouth sores and nosebleeds.   Eyes: Negative for photophobia, pain, discharge,  redness, itching and visual disturbance.  Respiratory: Negative for cough, shortness of breath and wheezing.   Cardiovascular: Negative for chest pain and leg swelling.  Gastrointestinal: Negative for constipation, diarrhea, nausea and vomiting.  Endocrine: Negative for polydipsia, polyphagia and polyuria.  Genitourinary: Negative for difficulty urinating.  Musculoskeletal: Positive for gait problem and myalgias. Negative for arthralgias, back pain, joint swelling, neck pain and neck stiffness.  Skin: Negative for color change, pallor, rash and wound.  Allergic/Immunologic: Negative for environmental allergies and food allergies.  Neurological: Negative for dizziness, tremors, syncope, weakness and headaches.  Hematological: Negative for adenopathy. Does not bruise/bleed easily.  Psychiatric/Behavioral: Negative for sleep disturbance.    Allergies  Tramadol  Home Medications   Prior to Admission medications   Medication Sig Start Date End Date Taking? Authorizing Provider  citalopram (CELEXA) 20 MG tablet TAKE 1 TABLET (20 MG TOTAL) BY MOUTH DAILY. 10/11/15  Yes Reubin Milan, MD  gabapentin (NEURONTIN) 300 MG capsule Take 1 capsule (300 mg total) by mouth 3 (three) times daily. 10/06/15  Yes Reubin Milan, MD  ibuprofen (ADVIL,MOTRIN) 800 MG tablet Take by mouth. 10/02/15  Yes Historical Provider, MD  ondansetron (ZOFRAN) 4 MG tablet Take 1 tablet (4 mg total) by mouth every 8 (eight) hours as needed for nausea or vomiting. 05/30/15  Yes Phineas Semen, MD  pantoprazole (PROTONIX) 40 MG tablet Take 1 tablet (40 mg total) by mouth daily. Patient taking differently: Take 40 mg by mouth daily as needed (for heartburn/indigestion.).  07/20/15 07/19/16 Yes Reubin Milan, MD  sucralfate (CARAFATE) 1 g tablet TAKE 1 TABLET (1 G TOTAL) BY MOUTH 4 (FOUR) TIMES DAILY. 09/21/15  Yes Reubin Milan, MD  acetaminophen (TYLENOL) 500 MG tablet Take 2 tablets (1,000 mg total) by mouth every 6 (six)  hours as needed. 10/18/15   Barbaraann Barthel, NP  doxepin (SINEQUAN) 10 MG capsule Take 1 capsule by mouth at bedtime as needed (for sleep.).  10/24/14   Historical Provider, MD   Meds Ordered and Administered this Visit  Medications - No data to display  BP 132/84 (BP Location: Left Arm)   Pulse 87   Temp 98.4 F (36.9 C) (Oral)   Resp 16   Ht 5\' 6"  (1.676 m)   Wt 135 lb (61.2 kg)   SpO2 100%   BMI 21.79 kg/m  No data found.   Physical Exam  Constitutional: Lucas Guerra is oriented to person, place, and time. Vital signs are normal. Lucas Guerra appears well-developed and well-nourished. Lucas Guerra is active and cooperative.  Non-toxic appearance. Lucas Guerra does not have a sickly appearance. Lucas Guerra does not appear ill. No distress.  HENT:  Head: Normocephalic and atraumatic.  Right Ear: Hearing and external ear normal.  Left Ear: Hearing and external ear normal.  Nose: Nose normal.  Mouth/Throat: Oropharynx is clear and moist.  Eyes: Conjunctivae, EOM and lids are normal. Pupils are equal, round, and reactive to light. Right eye exhibits no discharge. Left eye exhibits no discharge. No scleral icterus.  Neck: Normal range of motion. Neck supple.  Cardiovascular: Normal rate, regular rhythm, normal heart sounds and intact distal pulses.   No murmur heard. Pulses:      Dorsalis pedis pulses are 2+ on the right side, and 2+ on the left side.       Posterior tibial pulses are 2+ on the right side, and 2+ on the left side.  Pulmonary/Chest: Effort normal and breath sounds normal. No respiratory distress.  Abdominal: Soft. There is no tenderness.  Musculoskeletal: Lucas Guerra exhibits tenderness. Lucas Guerra exhibits no edema or deformity.       Right shoulder: Normal.       Left shoulder: Normal.       Right elbow: Normal.      Left elbow: Normal.       Right hip: Normal.       Left hip: Normal.       Right knee: Normal.       Left knee: Normal.       Right ankle: Normal.       Left ankle: Lucas Guerra exhibits decreased range of motion and  swelling. Lucas Guerra exhibits no ecchymosis, no deformity, no laceration and normal pulse. Tenderness. Lateral malleolus and medial malleolus tenderness found. Achilles tendon normal.       Cervical back: Normal.       Thoracic back: Normal.       Lumbar back: Normal.       Right hand: Normal.       Left hand: Normal.       Feet:  Feet:  Right Foot:  Skin Integrity: Positive for callus. Negative for erythema.  Left Foot:  Skin Integrity: Positive for callus. Negative for erythema.  Lymphadenopathy:    Lucas Guerra has no cervical adenopathy.  Neurological: Lucas Guerra is alert and oriented to person, place, and time. Lucas Guerra displays atrophy. Lucas Guerra displays no tremor and normal reflexes. Lucas Guerra exhibits normal muscle tone. Lucas Guerra displays no seizure activity. Gait abnormal. Coordination normal. GCS eye subscore  is 4. GCS verbal subscore is 5. GCS motor subscore is 6.  Dorsiflexion 3/5 left 5/5 right; bilateral knees/hips/arms/hands 5/5  Skin: Skin is warm and dry. Capillary refill takes less than 2 seconds. No rash noted. Lucas Guerra is not diaphoretic. No cyanosis or erythema. No pallor. Nails show no clubbing.     Healed scars left leg/foot x 2; 3cm scab linear noted dorsum left foot; callouses heels  Psychiatric: Lucas Guerra has a normal mood and affect. His behavior is normal. Judgment and thought content normal.  Nursing note and vitals reviewed.   Urgent Care Course   Clinical Course  Comment By Time  Patient notified of xray results and given copy of disc and radiology report to follow up with orthopedics as nonhealed fibula fracture seen today Barbaraann Barthel, NP 07/26 1635    Procedures (including critical care time)  Labs Review Labs Reviewed - No data to display  Imaging Review Dg Ankle Complete Left  Result Date: 10/18/2015 CLINICAL DATA:  Fracture the ankle 2-3 months ago to with ORIF, followup EXAM: LEFT ANKLE COMPLETE - 3+ VIEW COMPARISON:  Left ankle films of 08/19/2015 FINDINGS: Plate and screw fixation of the distal  left fibular fracture is noted with nonunion of the distal fibular fracture. Two screws are present through the medial malleolus with some interval healing of the medial malleolar fracture. The ankle joint appears normal and alignment is normal. IMPRESSION: 1. 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 2. Interval healing of the medial malleolar fracture with screw fixation. Electronically Signed   By: Dwyane Dee M.D.   On: 10/18/2015 15:50  Dg Foot Complete Left  Result Date: 10/18/2015 CLINICAL DATA:  ORIF of ankle fracture 2-3 months ago with some falls since EXAM: LEFT FOOT - COMPLETE 3+ VIEW COMPARISON:  Left foot films of 10/04/2015 FINDINGS: Hardware for fixation of medial and lateral malleolar fractures again are noted. However no abnormality of the left foot is seen. Tarsal -metatarsal alignment is normal. Joint spaces appear normal. IMPRESSION: Negative left foot. Electronically Signed   By: Dwyane Dee M.D.   On: 10/18/2015 15:58   1500  Reviewed previous xray ankle appears to be healing pending repeat views today.  Reviewed [place] Controlled Substances Reporting System and EPIC patient was referred to pain management by Dr Bari Edward earlier this month and given gabapentin and motrin after ER gave him naproxen prior to that visit and told to taper oxycodone. Last Name:  [place]  [place]:  First Name:  Lucas Guerra  Zip Code:  Date of Birth:  07/20/1985  Dispensed Start Date:  09/23/2014 Dispensed End Date:  10/18/2015  Recipients:          Dispensed From 09/23/2014 to 10/18/2015 5 out of 5 Recipient(s) Selected Hufstetler, Imanuel - DOB: [birth year] - [address] Dadisman, Woodley - DOB: 1985-06-20 - [address] [place], [place] - DOB: 15-Mar-1986 - [address] Stahle, Guerino - DOB: 1985-06-29 - [address] [place], [place] - DOB: [birth year] - [address] " Date Dispensed/ Date Prescribed Drug Name/ NDC Qty.  Dispensed/ Days Supply Refill #/ Authorized Refills Gaffer Recipient *Pmt. Method **MED Daily  10/02/2015 10/02/2015 OXYCODONE HCL 5 MG TABLET [id number] 20 10 0 0 [id number] MCGHEE JAMES L (MPAS) [place], [place] [id number] [place] [facility] [place], [place] Bradeen, Addis [birth year] [address] [place], [place] [address] [phone] 15  09/23/2015 09/22/2015 OXYCODONE HCL 5 MG  TABLET [id number] 30 10 0 0 [address] L (MPAS) [place], [place] [id number] Margretta Sidle, [place] Ashurst, Esli 11-18-1985 [address] [place], [place] [address] 03 22.5  09/13/2015 09/13/2015 OXYCODONE HCL 5 MG TABLET [id number] 40 10 0 0 [address] L (MPAS) [place], [place] [id number] Margretta Sidle, [place] Job, Turhan 05/06/1985 [address] [place], [place] [address] [phone]  09/05/2015 09/04/2015 OXYCODONE HCL 5 MG TABLET [id number] 40 7 0 0 [id number] Fidel Levy JEFFREY MD [place], [place] [id number] [address] [place] Diskin, Idan 1985-07-19 [address] [place], [place] [address] 04 42.86  08/28/2015 08/28/2015 OXYCODONE HCL 5 MG TABLET [id number] 40 8 0 0 [address] (MPAS) [place], [place] [id number] Margretta Sidle, [place], Mamoudou Apr 21, 1985 [address] [place], [place] [address] 03 37.5  08/19/2015 08/19/2015 OXYCODONE HCL 5 MG TABLET [id number] 50 9 0 0 [id number] Fidel Levy JEFFREY MD [place], [place] [id number] [facility] [place], [place], Ulys 1985-07-16 [address] [place], [place] [address] 03 41.67  08/07/2015 08/07/2015 OXYCODONE- ACETAMINOPHEN 5- 325 [id number] 10 2 0 0 [id number] PARK AARON DDS MD [place], [place] [id number] [place] [facility] [place], [place] Prather, Johnaton 1985-07-30 [address] [place], [place] [address] 03 37.5  07/31/2015 07/31/2015 OXYCODONE- ACETAMINOPHEN 5- 325 [id number] 10 2 0 0 [id number] PARK AARON DDS MD [place],  [place] [id number] Margretta Sidle, Roll[place] Scioneaux, Gio [birth year] [address] [place], [place] [address] 03 37.5  07/28/2015 07/28/2015 HYDROCODON- ACETAMINOPHEN 5- 325 [id number] 20 5 0 0 [id number] PARK AARON DDS MD [place], [place] [id number] Margretta Sidle, Baden Haymore, Wyland [birth year] [address] [place], [place] [address] 03 20  07/17/2015 07/17/2015 HYDROCODON- ACETAMINOPHEN 5- 325 [id number] 20 4 0 0 [id number] [facility] [address] [place], [place] [id number] Margretta Sidle, [place] Fraser, Jkwon [birth year] [address] [place], [place] [address] 03 25  07/07/2015 07/07/2015 VICODIN 5- 300 MG TABLET [id number] 15 3 0 0 [address] J (DMD) [place], [place] [id number] Margretta Sidle, [place] Langton, Albie 03-23-1986 [address] [place], [place] [address] 03 25  05/01/2015 05/01/2015 HYDROCODON- ACETAMINOPHEN 5- 300 [id number] 15 2 0 0 [id number] GARNER Dia Sitter (DDS) [place], [place] [id number] [place] [facility] [place], [place] Ahrendt, [place] October 14, 1985 [address] [place], [place] [address] 03 37.5  03/22/2015 03/19/2015 ACETAMINOPHEN- COD #3 TABLET [id number] 12 2 0 0 [id number] Jeannie Done MD [place], [place] [id number] Margretta Sidle, [place] Brutus, Thai Mar 31, 1985 [address] [place], [place] [address] 03 27  03/06/2015 03/06/2015 OXYCODONE- ACETAMINOPHEN 5- 325 [id number] 10 1 0 0 [id number] PARK AARON DDS MD [place], [place] [id number] Rennis Harding, [place] Guest, Mick 20-Jan-1986 [address] [place], [place] [address] 03 75  03/02/2015 03/02/2015 OXYCODONE- ACETAMINOPHEN 5- 325 [id number] 20 3 0 0 [id number] PARK AARON DDS MD [place], [place] [id number] Margretta Sidle, [place] Quashie, Kharon 1985-09-18 [address] [place], [place] [address] 03 50  03/02/2015 01/24/2015 ALPRAZOLAM 1 MG TABLET [id number] 60 30 0 0 [id number] SISK KATHLEEN S (MSN PMH-NP BC) [place], [place] [id number] [place] [facility] [place], [place] Glogowski, Waldon [birth year] [address] [place], [place] [address] 03 0  02/22/2015 02/22/2015 OXYCODONE- ACETAMINOPHEN 5- 325 [id number] 10 1 0 0 [id number] PARK AARON DDS MD [place], [place] [id number] Margretta Sidle,  Geralds, Darshawn [birth year] [address] [place], [place] [address] 03 75  01/24/2015 01/24/2015 ALPRAZOLAM 1 MG TABLET [id number] 60 30 0 0 [id number] SISK Benjaman Pott (MSN PMH-NP BC) [place], [place] [id number] [place] [facility] [place], [place] Sandlin, Dorell November 02, 1985 [address] [place], [place] [address] 03 0  01/17/2015 01/17/2015 OXYCODONE- ACETAMINOPHEN 5- 325 [id number] 10 1 0 0 [id number] PARK AARON DDS MD [place], [place] [id number] Margretta Sidle, [place]gle Abend, Kendle [birth year] [address] [place], [place] [address] 03 75  01/09/2015 01/09/2015 OXYCODONE- ACETAMINOPHEN 5- 325 [id number] 10 2 0 0 [id number] PARK AARON DDS MD [place], Mabie [id number] [facility] [id number] [place](N), [place] Nixon, Levelle [birth year] [address] [place], [place] [address] 01 37.5  01/06/2015 01/06/2015 HYDROCODON- ACETAMINOPHEN 5- 325 [id number] 10 1 0 0 [id number] PARK AARON DDS MD [place], [place] [id number] Margretta Sidle, [place] Maple, Emiliano [birth year] [address] [place], [place] [address] 03 50  01/03/2015 01/03/2015 ACETAMINOPHEN- COD #3 TABLET [id number] 20 3 0 0 [id number] PARK AARON DDS MD [place], [place] [id number] [address] [place] Vandagriff, Amon 12-17-1985 [address] [place], [place] [address] 04 30  01/03/2015 01/03/2015 ACETAMINOPHEN- COD #3 TABLET [id number] 20 2 0 0 [id number] PARK AARON DDS MD [place], [place] [id number] [facility] [id number] [place](N), [place] Marquart, Lamarius [birth year] [address] [place], [place] [address] 01 45  12/28/2014 12/28/2014 HYDROCODON- ACETAMINOPHEN 5- 325 [id number] 30 5 0 0 [id number] PARK AARON DDS MD [place], [place] [id number] Margretta Sidle, [place] Mersman,  Marshon 04-Oct-1985 [address] [place], [place] [address] 03 30  12/26/2014 12/26/2014 ALPRAZOLAM 1 MG TABLET [id number] 60 30 0 0 [id number] SU HANS[place], [place] [id number] [place] [facility] [place], [place] Scheiber, Latrel 01/27/1986 [address] [place], [place] [address] 03 0  12/21/2014 12/21/2014 HYDROCODON- ACETAMINOPHEN 5- 325 [id number] 12 1 0 0 [id number] GUO ZHANYING DDS [place], [place] [id number] Margretta Sidle, [place] Vandrunen, Ira 01-01-1986 [address] [place], [place] [address] 03 60  12/14/2014 12/14/2014 HYDROCODON- ACETAMINOPHEN 5- 325 [id number] 12 2 0 0 [id number] GEZ MOQHUTML YYT [place], [place] [id number] [address] [place] Sgro, Hakeen October 03, 1985 [address] [place], [place] [address] 04 30  11/24/2014 11/16/2014 ALPRAZOLAM 1 MG TABLET [id number] 60 30 0 0 [id number] SISK Benjaman Pott (MSN PMH-NP BC) [place], [place] [id number] [place] [facility] [place], [place] Jent, Juston 04-25-1985 [address] [place], [place] [address] 03 0  11/05/2014 11/04/2014 HYDROCODON- ACETAMINOPHEN 5- 325 [id number] 8 2 0 0 [id number] Idelle Jo NP [place], [place] [id number] [address] [place] Carlson, Chisom Aug 21, 1985 [address] [place], [place] [address] 04 20  11/03/2014 11/03/2014 HYDROCODON- ACETAMINOPHN 10- 325 [id number] 15 4 0 0 [id number] PLONK Dionne Ano MD [place], [place] [id number] [place] [facility] [place], [place] Hammack, Zalen 01-21-1986 [address] [place], [place] [address] 03 37.5  10/28/2014 10/28/2014 ALPRAZOLAM 1 MG TABLET [id number] 60 30 0 0 [id number] SISK Benjaman Pott (MSN PMH-NP BC) [place], [place] [id number] [place] [facility] [place], [place] Barner, Barack January 09, 1986 [address] [place], [place] [address] 03 0  09/30/2014 09/30/2014 ALPRAZOLAM 1 MG TABLET [id number] 60 30 0 0 [id number] SISK Benjaman Pott (MSN PMH-NP  BC) [place], [place] [id number] [place] [facility] [place],  [place], Siraj 1985-04-07 [address] [place], [place] [address] 03 0    *Pmt. Method:01=Private Pay; 02=Medicaid; 03=Medicare; 04=Commercial Insurance; 05=Military  Installations and VA; 06=Worker's Compensation; 07=Indian Nations; 99=Other  **Per [employer], the conversion factors and associated daily morphine milligram equivalents for drugs prescribed as part of medication-assisted treatment for opioid use disorder should not be used to benchmark against dosage thresholds meant for opioids prescribed for pain. MED Summary This section displays cumulative MED values by unique recipient. The "MED Max" value is the maximum occurrence of cumulative MED sustained for any 3 consecutive days. This value is calculated based on prescriptions dispensed during the date range requested.  MED Max Recipient  [address] [place] 12/24/1985 [address] [place], [place] [address]  50 [place], Khush 1986-02-07 [address] [place], [place] [address]    . 1700 discussed xray results with PA [facility]  Discussed with patient Risk of possible prolonged functional impairment without treatment due to the injury Moderate Patient has appt Friday 28 Jul at 0830 with Dr Jacquelin Hawking [place] office at [facility].  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  Patient notified of above.  Patient urinated to bathroom with crutches without difficulty.    Was notified patient contact orthopedics office for refill of oxycodone today x3 and was told no further fills were going to occur and follow up with PCM.  Patient had agreed to titrating up gabapentin  po TID and taking NSAIDs motrin  po TID and tylenol  po QID on a regular schedule along with cryotherapy and elevation/nonweightbearing.  Patient verbalized  understanding information/instructions, agreed with plan of care and had no further questions at this time.   MDM   1. Fibula fracture, right, closed, with delayed healing, subsequent encounter   2. Nerve pain    Return to [facility] orthopedics clinic for re-evaluation call for appt today to schedule later this week.  Avoid weight bearing use crutches, wear cam boot at all times.  Contact PCM and notify them of recent fall.  Gabapentin  po TID, motrin  po TID and may increase gabapentin  per 24 hours until on  po TID.  Keep pain management appt 8 Aug at [facility].  Keep elevated and ice four times per day as dependent position will worsen pain.  Orthopedics recommends titrating off oxycodone along with PCM Judithann Graves also who set up patient referral. Talk with PCM and orthopedics regarding physical therapy.  Patient verbalized understanding information/instructions, agreed with plan of care and had no further questions at this time.      Barbaraann Barthel, NP 10/18/15 1728    Barbaraann Barthel, NP 10/18/15 1731

## 2015-10-18 NOTE — Discharge Instructions (Signed)
Keep elevated, ice x 15 minutes 4 times per day; wear cam boot, use crutches no weight bearing; take medications as prescribed three times per day.  Increase gabapentin dose by 1 tablet 300mg  per day if no relief with 300mg  by mouth three times per day as only taking twice a day at this time

## 2015-11-11 ENCOUNTER — Encounter: Payer: Self-pay | Admitting: Emergency Medicine

## 2015-11-11 ENCOUNTER — Emergency Department: Payer: Medicare Other

## 2015-11-11 ENCOUNTER — Emergency Department
Admission: EM | Admit: 2015-11-11 | Discharge: 2015-11-11 | Disposition: A | Discharge status: Home / Self Care | Payer: Medicare Other | Attending: Emergency Medicine | Admitting: Emergency Medicine

## 2015-11-11 DIAGNOSIS — Z79899 Other long term (current) drug therapy: Secondary | ICD-10-CM | POA: Diagnosis not present

## 2015-11-11 DIAGNOSIS — S82892K Other fracture of left lower leg, subsequent encounter for closed fracture with nonunion: Secondary | ICD-10-CM

## 2015-11-11 DIAGNOSIS — S89392K Other physeal fracture of lower end of left fibula, subsequent encounter for fracture with nonunion: Secondary | ICD-10-CM | POA: Insufficient documentation

## 2015-11-11 DIAGNOSIS — F17210 Nicotine dependence, cigarettes, uncomplicated: Secondary | ICD-10-CM | POA: Insufficient documentation

## 2015-11-11 DIAGNOSIS — M25572 Pain in left ankle and joints of left foot: Secondary | ICD-10-CM | POA: Diagnosis not present

## 2015-11-11 DIAGNOSIS — S82832K Other fracture of upper and lower end of left fibula, subsequent encounter for closed fracture with nonunion: Secondary | ICD-10-CM | POA: Diagnosis not present

## 2015-11-11 DIAGNOSIS — X58XXXD Exposure to other specified factors, subsequent encounter: Secondary | ICD-10-CM | POA: Diagnosis not present

## 2015-11-11 MED ORDER — OXYCODONE-ACETAMINOPHEN 5-325 MG PO TABS: 1.0000 | ORAL_TABLET | Freq: Four times a day (QID) | ORAL | 0 refills | Status: DC | PRN

## 2015-11-11 NOTE — Discharge Instructions (Signed)
Follow-up with Dr.Poggi your orthopedic surgeon.

## 2015-11-11 NOTE — ED Provider Notes (Signed)
[facility] Emergency Department Provider Note  ____________________________________________  Time seen: Approximately 12:54 PM  I have reviewed the triage vital signs and the nursing notes.   HISTORY  Chief Complaint Ankle Pain    HPI Lucas Guerra is a 30 y.o. male resents with continued left ankle pain. Patient reports that he broke his ankle approximately3 months ago. Patient states he saw urgent care approximately 2-3 weeks ago and he told that he refractured his ankle. Patient is here today with  continued ankle pain. States is very painful and throbbing hurts to walk.  Using crutches.   Past Medical History:  Diagnosis Date  . Anxiety   . GERD (gastroesophageal reflux disease)   . Heart murmur     Patient Active Problem List   Diagnosis Date Noted  . Closed displaced trimalleolar fracture of left lower leg 08/22/2015  . N&V (nausea and vomiting) 06/14/2015  . 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 Paranoid schizophrenia (HCC) 11/02/2014  . Acid reflux 11/02/2014  . Chronic peptic ulcer without hemorrhage or perforation 11/02/2014  . Dysphagia 11/02/2014  . Disorder of mitral valve 11/02/2014  . Back ache 11/02/2014  . BP (high blood pressure) 11/02/2014    Past Surgical History:  Procedure Laterality Date  . ORIF ANKLE FRACTURE Right 08/19/15  . ORIF ANKLE FRACTURE Left 08/19/2015   Procedure: OPEN REDUCTION INTERNAL FIXATION (ORIF) ANKLE FRACTURE;  Surgeon: Christena Flake, MD;  Location: [facility] ORS;  Service: Orthopedics;  Laterality: Left;    Prior to Admission medications   Medication Sig Start Date End Date Taking? Authorizing Provider  acetaminophen (TYLENOL) 500 MG tablet Take 2 tablets (1,000 mg total) by mouth every 6 (six) hours as needed. 10/18/15   Barbaraann Barthel, NP  citalopram (CELEXA) 20 MG tablet TAKE 1 TABLET (20 MG TOTAL) BY MOUTH DAILY. 10/11/15   Reubin Milan, MD  doxepin (SINEQUAN) 10 MG capsule Take 1 capsule  by mouth at bedtime as needed (for sleep.).  10/24/14   Historical Provider, MD  gabapentin (NEURONTIN) 300 MG capsule Take 1 capsule (300 mg total) by mouth 3 (three) times daily. 10/06/15   Reubin Milan, MD  ibuprofen (ADVIL,MOTRIN) 800 MG tablet Take by mouth. 10/02/15   Historical Provider, MD  ondansetron (ZOFRAN) 4 MG tablet Take 1 tablet (4 mg total) by mouth every 8 (eight) hours as needed for nausea or vomiting. 05/30/15   Phineas Semen, MD  oxyCODONE-acetaminophen (ROXICET) 5-325 MG tablet Take 1 tablet by mouth every 6 (six) hours as needed. 11/11/15 11/10/16  Charmayne Sheer Beers, PA-C  pantoprazole (PROTONIX) 40 MG tablet Take 1 tablet (40 mg total) by mouth daily. Patient taking differently: Take 40 mg by mouth daily as needed (for heartburn/indigestion.).  07/20/15 07/19/16  Reubin Milan, MD  sucralfate (CARAFATE) 1 g tablet TAKE 1 TABLET (1 G TOTAL) BY MOUTH 4 (FOUR) TIMES DAILY. 09/21/15   Reubin Milan, MD    Allergies Tramadol  Family History  Problem Relation Age of Onset  . Hypertension Mother   . Diabetes Father     Social History Social History  Substance Use Topics  . Smoking status: Current Every Day Smoker    Packs/day: 1.50    Years: 12.00    Types: Cigarettes  . Smokeless tobacco: Never Used  . Alcohol use No     Comment: rare consumption    Review of Systems Constitutional: No fever/chills Musculoskeletal: Positive for left ankle pain. Skin: Negative for rash. Neurological: Negative  for headaches, focal weakness or numbness.  10-point ROS otherwise negative.  ____________________________________________   PHYSICAL EXAM:  VITAL SIGNS: ED Triage Vitals  Enc Vitals Group     BP 11/11/15 1246 131/81     Pulse Rate 11/11/15 1246 97     Resp 11/11/15 1246 18     Temp 11/11/15 1246 98.5 F (36.9 C)     Temp Source 11/11/15 1246 Oral     SpO2 11/11/15 1246 99 %     Weight 11/11/15 1247 145 lb (65.8 kg)     Height 11/11/15 1247 5\' 6"  (1.676 m)      Head Circumference --      Peak Flow --      Pain Score 11/11/15 1247 8     Pain Loc --      Pain Edu? --      Excl. in GC? --     Constitutional: Alert and oriented. Well appearing and in no acute distress. Musculoskeletal: Positive left ankle pain with inability to walk without crutches. Neurologic:  Normal speech and language. No gross focal neurologic deficits are appreciated. Positive gait instability. Skin:  Skin is warm, dry and intact. No rash noted. Psychiatric: Mood and affect are normal. Speech and behavior are normal.  ____________________________________________   LABS (all labs ordered are listed, but only abnormal results are displayed)  Labs Reviewed - No data to display ____________________________________________  EKG   ____________________________________________  RADIOLOGY  IMPRESSION:  Postoperative change in the medial malleolus and distal fibular  regions. There has been interval progression of healing in the  medial malleolar region compared to most recent prior study. In the  distal fibular region, however, there is persistent nonunion with  several small bony fragments in this area, stable. There is a small  joint effusion. No new fracture evident. Ankle mortise appears  intact.    It should be cautioned that subtle osteomyelitis in the distal  fibular region may be quite difficult to discern given the other  changes present.    ____________________________________________   PROCEDURES  Procedure(s) performed: None  Critical Care performed: No  ____________________________________________   INITIAL IMPRESSION / ASSESSMENT AND PLAN / ED COURSE  Pertinent labs & imaging results that were available during my care of the patient were reviewed by me and considered in my medical decision making (see chart for details). Review of the [place] CSRS was performed in accordance of the [facility] prior to dispensing any controlled  drugs.  Continued trimalleolar fracture left ankle. Patient given prescription for Percocet 5/325 and follow-up with orthopedics as scheduled.  Clinical Course    ____________________________________________   FINAL CLINICAL IMPRESSION(S) / ED DIAGNOSES  Final diagnoses:  Ankle fracture, left, closed, with nonunion, subsequent encounter     This chart was dictated using voice recognition software/Dragon. Despite best efforts to proofread, errors can occur which can change the meaning. Any change was purely unintentional.    Evangeline Dakinharles M Beers, PA-C 11/11/15 1447    Sharman CheekPhillip Stafford, MD 11/14/15 [id number]

## 2015-11-11 NOTE — ED Triage Notes (Signed)
States fractured ankle 3 months ago, has been repaired, states 2 weeks ago urgent care told him he had new fracture. Painful.

## 2015-12-04 ENCOUNTER — Encounter: Payer: Self-pay | Admitting: *Deleted

## 2015-12-04 ENCOUNTER — Emergency Department
Admission: EM | Admit: 2015-12-04 | Discharge: 2015-12-04 | Disposition: A | Discharge status: Home / Self Care | Payer: Medicare Other | Attending: Emergency Medicine | Admitting: Emergency Medicine

## 2015-12-04 ENCOUNTER — Emergency Department: Payer: Medicare Other

## 2015-12-04 DIAGNOSIS — S8292XK Unspecified fracture of left lower leg, subsequent encounter for closed fracture with nonunion: Secondary | ICD-10-CM | POA: Diagnosis not present

## 2015-12-04 DIAGNOSIS — F17210 Nicotine dependence, cigarettes, uncomplicated: Secondary | ICD-10-CM | POA: Insufficient documentation

## 2015-12-04 DIAGNOSIS — S82892K Other fracture of left lower leg, subsequent encounter for closed fracture with nonunion: Secondary | ICD-10-CM

## 2015-12-04 DIAGNOSIS — W010XXD Fall on same level from slipping, tripping and stumbling without subsequent striking against object, subsequent encounter: Secondary | ICD-10-CM | POA: Insufficient documentation

## 2015-12-04 DIAGNOSIS — M25572 Pain in left ankle and joints of left foot: Secondary | ICD-10-CM | POA: Diagnosis present

## 2015-12-04 DIAGNOSIS — S82842A Displaced bimalleolar fracture of left lower leg, initial encounter for closed fracture: Secondary | ICD-10-CM | POA: Diagnosis not present

## 2015-12-04 MED ORDER — NAPROXEN 500 MG PO TABS: 500.0000 mg | ORAL_TABLET | Freq: Two times a day (BID) | ORAL | 0 refills | Status: DC

## 2015-12-04 NOTE — ED Provider Notes (Signed)
[facility] Emergency Department Provider Note  ____________________________________________   First MD Initiated Contact with Patient 12/04/15 1311     (approximate)  I have reviewed the triage vital signs and the nursing notes.   HISTORY  Chief Complaint Ankle Pain   HPI Martyn Timme Lisanti is a 30 y.o. male is here with complaint of left ankle pain.Patient is status post internal fixation for a fracture approximately 4 months ago.  He is seen in the emergency room on 11/11/15 for continued left ankle pain. Patient was to make an appointment with the orthopedist about his continued ankle pain. Patient states that he was supposed to have been doing physical therapy and has not. Patient also today states that last week he "slipped" which has caused increased ankle pain. Patient continues to ambulate without assistance. Patient denies use of alcohol and recreational drugs. Patient continues to smoke cigarettes one half pack cigarettes per day. He rates his pain as an 8/10.   Past Medical History:  Diagnosis Date  . Anxiety   . GERD (gastroesophageal reflux disease)   . Heart murmur     Patient Active Problem List   Diagnosis Date Noted  . Closed displaced trimalleolar fracture of left lower leg 08/22/2015  . N&V (nausea and vomiting) 06/14/2015  . 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 Paranoid schizophrenia (HCC) 11/02/2014  . Acid reflux 11/02/2014  . Chronic peptic ulcer without hemorrhage or perforation 11/02/2014  . Dysphagia 11/02/2014  . Disorder of mitral valve 11/02/2014  . Back ache 11/02/2014  . BP (high blood pressure) 11/02/2014    Past Surgical History:  Procedure Laterality Date  . ORIF ANKLE FRACTURE Right 08/19/15  . ORIF ANKLE FRACTURE Left 08/19/2015   Procedure: OPEN REDUCTION INTERNAL FIXATION (ORIF) ANKLE FRACTURE;  Surgeon: Christena Flake, MD;  Location: [facility] ORS;  Service: Orthopedics;  Laterality: Left;    Prior to Admission  medications   Medication Sig Start Date End Date Taking? Authorizing Provider  citalopram (CELEXA) 20 MG tablet TAKE 1 TABLET (20 MG TOTAL) BY MOUTH DAILY. 10/11/15   Reubin Milan, MD  doxepin (SINEQUAN) 10 MG capsule Take 1 capsule by mouth at bedtime as needed (for sleep.).  10/24/14   Historical Provider, MD  naproxen (NAPROSYN) 500 MG tablet Take 1 tablet (500 mg total) by mouth 2 (two) times daily with a meal. 12/04/15   Tommi Rumps, PA-C  ondansetron (ZOFRAN) 4 MG tablet Take 1 tablet (4 mg total) by mouth every 8 (eight) hours as needed for nausea or vomiting. 05/30/15   Phineas Semen, MD  sucralfate (CARAFATE) 1 g tablet TAKE 1 TABLET (1 G TOTAL) BY MOUTH 4 (FOUR) TIMES DAILY. 09/21/15   Reubin Milan, MD    Allergies Tramadol  Family History  Problem Relation Age of Onset  . Hypertension Mother   . Diabetes Father     Social History Social History  Substance Use Topics  . Smoking status: Current Every Day Smoker    Packs/day: 1.50    Years: 12.00    Types: Cigarettes  . Smokeless tobacco: Never Used  . Alcohol use No     Comment: rare consumption    Review of Systems Constitutional: No fever/chills Eyes: No visual changes. ENT: No sore throat. Cardiovascular: Denies chest pain. Respiratory: Denies shortness of breath. Gastrointestinal: No abdominal pain.  No nausea, no vomiting.  Musculoskeletal: Positive for chronic left ankle pain. Skin: Negative for rash. Neurological: Negative for headaches, focal weakness or numbness.  10-point ROS otherwise negative.  ____________________________________________   PHYSICAL EXAM:  VITAL SIGNS: ED Triage Vitals  Enc Vitals Group     BP 12/04/15 1303 (!) 137/97     Pulse Rate 12/04/15 1303 97     Resp 12/04/15 1303 18     Temp 12/04/15 1303 98.7 F (37.1 C)     Temp Source 12/04/15 1303 Oral     SpO2 12/04/15 1303 100 %     Weight 12/04/15 1304 145 lb (65.8 kg)     Height 12/04/15 1304 5\' 6"  (1.676 m)      Head Circumference --      Peak Flow --      Pain Score 12/04/15 1304 8     Pain Loc --      Pain Edu? --      Excl. in GC? --     Constitutional: Alert and oriented. Well appearing and in no acute distress. Eyes: Conjunctivae are normal. PERRL. EOMI. Head: Atraumatic. Nose: No congestion/rhinnorhea. Neck: No stridor.   Cardiovascular: Normal rate, regular rhythm. Grossly normal heart sounds.  Good peripheral circulation. Respiratory: Normal respiratory effort.  No retractions. Lungs CTAB. Musculoskeletal: Examination of the left ankle there is a well-healed surgical scar. There is minimal soft tissue swelling. There is tenderness generalized for the left ankle. Range of motion is restricted secondary to discomfort. There is no warmth or redness noted to the joint. Motor sensory function intact. Patient is able to move digits distally. Neurologic:  Normal speech and language. No gross focal neurologic deficits are appreciated. No gait instability. Skin:  Skin is warm, dry and intact. No ecchymosis, abrasions, erythema was noted. Psychiatric: Mood and affect are normal. Speech and behavior are normal.  ____________________________________________   LABS (all labs ordered are listed, but only abnormal results are displayed)  Labs Reviewed - No data to display RADIOLOGY  Left ankle x-ray per radiologist 1. No meaningful healing of bimalleolar fractures status post ORIF  compatible with nonunion.   I, Tommi Rumpshonda L Summers, personally viewed and evaluated these images (plain radiographs) as part of my medical decision making, as well as reviewing the written report by the radiologist.  ____________________________________________   PROCEDURES  Procedure(s) performed: None  Procedures  Critical Care performed: No  ____________________________________________   INITIAL IMPRESSION / ASSESSMENT AND PLAN / ED COURSE  Pertinent labs & imaging results that were available during my  care of the patient were reviewed by me and considered in my medical decision making (see chart for details).    Clinical Course   Patient was encouraged to make an appointment with Dr. Joice LoftsPoggi to talk about options for his continued ankle pain. He was given a prescription for naproxen 500 mg twice a day with food. He is aware that his x-ray today does not show an acute fracture but a nonunion of his previous fracture.  ____________________________________________   FINAL CLINICAL IMPRESSION(S) / ED DIAGNOSES  Final diagnoses:  Fracture, ankle, left, closed, with nonunion, subsequent encounter      NEW MEDICATIONS STARTED DURING THIS VISIT:  Discharge Medication List as of 12/04/2015  2:20 PM    START taking these medications   Details  naproxen (NAPROSYN) 500 MG tablet Take 1 tablet (500 mg total) by mouth 2 (two) times daily with a meal., Starting Mon 12/04/2015, Print         Note:  This document was prepared using Dragon voice recognition software and may include unintentional dictation errors.    Rhonda L  Levada Schilling, PA-C 12/04/15 1439    Emily Filbert, MD 12/04/15 [phone]

## 2015-12-04 NOTE — Discharge Instructions (Signed)
Call today and make an appointment with the orthopedist. Begin taking naproxen twice a day with food. You have a nonunion of your ankle fracture and will need to see an orthopedist about this.

## 2015-12-04 NOTE — ED Triage Notes (Signed)
States left ankle pain, states he was seen in ER and had surgery 5 months ago, pt ambulatory

## 2015-12-04 NOTE — ED Notes (Signed)
States he stepped down wrong about 1 week ago  Having increased pain and swelling to left ankle

## 2015-12-08 DIAGNOSIS — M79662 Pain in left lower leg: Secondary | ICD-10-CM | POA: Diagnosis not present

## 2015-12-08 DIAGNOSIS — M25572 Pain in left ankle and joints of left foot: Secondary | ICD-10-CM | POA: Diagnosis not present

## 2015-12-08 DIAGNOSIS — G894 Chronic pain syndrome: Secondary | ICD-10-CM | POA: Diagnosis not present

## 2016-03-26 DIAGNOSIS — M79662 Pain in left lower leg: Secondary | ICD-10-CM | POA: Diagnosis not present

## 2016-03-26 DIAGNOSIS — M25572 Pain in left ankle and joints of left foot: Secondary | ICD-10-CM | POA: Diagnosis not present

## 2016-03-26 DIAGNOSIS — Z79891 Long term (current) use of opiate analgesic: Secondary | ICD-10-CM | POA: Diagnosis not present

## 2016-03-26 DIAGNOSIS — G894 Chronic pain syndrome: Secondary | ICD-10-CM | POA: Diagnosis not present

## 2016-03-26 DIAGNOSIS — M79675 Pain in left toe(s): Secondary | ICD-10-CM | POA: Diagnosis not present

## 2016-05-02 DIAGNOSIS — G8929 Other chronic pain: Secondary | ICD-10-CM | POA: Diagnosis not present

## 2016-05-02 DIAGNOSIS — Z8781 Personal history of (healed) traumatic fracture: Secondary | ICD-10-CM | POA: Diagnosis not present

## 2016-05-02 DIAGNOSIS — S82852K Displaced trimalleolar fracture of left lower leg, subsequent encounter for closed fracture with nonunion: Secondary | ICD-10-CM | POA: Diagnosis not present

## 2016-05-02 DIAGNOSIS — Z967 Presence of other bone and tendon implants: Secondary | ICD-10-CM | POA: Diagnosis not present

## 2016-05-02 DIAGNOSIS — M25572 Pain in left ankle and joints of left foot: Secondary | ICD-10-CM | POA: Diagnosis not present

## 2016-05-03 ENCOUNTER — Other Ambulatory Visit: Payer: Self-pay | Admitting: Student

## 2016-05-03 DIAGNOSIS — M25572 Pain in left ankle and joints of left foot: Principal | ICD-10-CM

## 2016-05-03 DIAGNOSIS — S82852K Displaced trimalleolar fracture of left lower leg, subsequent encounter for closed fracture with nonunion: Secondary | ICD-10-CM

## 2016-05-03 DIAGNOSIS — Z8781 Personal history of (healed) traumatic fracture: Secondary | ICD-10-CM

## 2016-05-03 DIAGNOSIS — Z9889 Other specified postprocedural states: Secondary | ICD-10-CM

## 2016-05-03 DIAGNOSIS — G8929 Other chronic pain: Secondary | ICD-10-CM

## 2016-05-06 ENCOUNTER — Encounter: Payer: Self-pay | Admitting: Emergency Medicine

## 2016-05-06 ENCOUNTER — Emergency Department
Admission: EM | Admit: 2016-05-06 | Discharge: 2016-05-06 | Discharge status: Against Medical Advice | Payer: Medicare Other | Attending: Emergency Medicine | Admitting: Emergency Medicine

## 2016-05-06 ENCOUNTER — Emergency Department: Payer: Medicare Other

## 2016-05-06 ENCOUNTER — Emergency Department
Admission: EM | Admit: 2016-05-06 | Discharge: 2016-05-06 | Disposition: A | Discharge status: Home / Self Care | Payer: Medicare Other | Attending: Emergency Medicine | Admitting: Emergency Medicine

## 2016-05-06 DIAGNOSIS — W108XXA Fall (on) (from) other stairs and steps, initial encounter: Secondary | ICD-10-CM | POA: Insufficient documentation

## 2016-05-06 DIAGNOSIS — Y939 Activity, unspecified: Secondary | ICD-10-CM | POA: Diagnosis not present

## 2016-05-06 DIAGNOSIS — M25562 Pain in left knee: Secondary | ICD-10-CM | POA: Diagnosis not present

## 2016-05-06 DIAGNOSIS — S99912A Unspecified injury of left ankle, initial encounter: Secondary | ICD-10-CM | POA: Diagnosis not present

## 2016-05-06 DIAGNOSIS — R45851 Suicidal ideations: Secondary | ICD-10-CM | POA: Diagnosis present

## 2016-05-06 DIAGNOSIS — G8929 Other chronic pain: Secondary | ICD-10-CM

## 2016-05-06 DIAGNOSIS — F329 Major depressive disorder, single episode, unspecified: Secondary | ICD-10-CM | POA: Diagnosis not present

## 2016-05-06 DIAGNOSIS — S93402A Sprain of unspecified ligament of left ankle, initial encounter: Secondary | ICD-10-CM | POA: Diagnosis not present

## 2016-05-06 DIAGNOSIS — M25572 Pain in left ankle and joints of left foot: Secondary | ICD-10-CM

## 2016-05-06 DIAGNOSIS — F32A Depression, unspecified: Secondary | ICD-10-CM

## 2016-05-06 DIAGNOSIS — Z79899 Other long term (current) drug therapy: Secondary | ICD-10-CM | POA: Diagnosis not present

## 2016-05-06 DIAGNOSIS — Y929 Unspecified place or not applicable: Secondary | ICD-10-CM | POA: Insufficient documentation

## 2016-05-06 DIAGNOSIS — Y999 Unspecified external cause status: Secondary | ICD-10-CM | POA: Insufficient documentation

## 2016-05-06 DIAGNOSIS — F17210 Nicotine dependence, cigarettes, uncomplicated: Secondary | ICD-10-CM | POA: Insufficient documentation

## 2016-05-06 HISTORY — DX: Schizophrenia, unspecified: F20.9

## 2016-05-06 LAB — CBC
HCT: 41.6 % (ref 40.0–52.0)
HEMOGLOBIN: 13.9 g/dL (ref 13.0–18.0)
MCH: 29.6 pg (ref 26.0–34.0)
MCHC: 33.5 g/dL (ref 32.0–36.0)
MCV: 88.4 fL (ref 80.0–100.0)
PLATELETS: 214 10*3/uL (ref 150–440)
RBC: 4.7 MIL/uL (ref 4.40–5.90)
RDW: 13.2 % (ref 11.5–14.5)
WBC: 5 10*3/uL (ref 3.8–10.6)

## 2016-05-06 LAB — COMPREHENSIVE METABOLIC PANEL
ALT: 14 U/L — ABNORMAL LOW (ref 17–63)
ANION GAP: 5 (ref 5–15)
AST: 20 U/L (ref 15–41)
Albumin: 3.9 g/dL (ref 3.5–5.0)
Alkaline Phosphatase: 80 U/L (ref 38–126)
BUN: 8 mg/dL (ref 6–20)
CHLORIDE: 107 mmol/L (ref 101–111)
CO2: 30 mmol/L (ref 22–32)
Calcium: 9.5 mg/dL (ref 8.9–10.3)
Creatinine, Ser: 1.02 mg/dL (ref 0.61–1.24)
Glucose, Bld: 102 mg/dL — ABNORMAL HIGH (ref 65–99)
POTASSIUM: 4 mmol/L (ref 3.5–5.1)
SODIUM: 142 mmol/L (ref 135–145)
Total Bilirubin: 0.7 mg/dL (ref 0.3–1.2)
Total Protein: 6.8 g/dL (ref 6.5–8.1)

## 2016-05-06 LAB — ETHANOL: Alcohol, Ethyl (B): <5 mg/dL (ref <5)

## 2016-05-06 LAB — ACETAMINOPHEN LEVEL

## 2016-05-06 LAB — SALICYLATE LEVEL

## 2016-05-06 NOTE — ED Notes (Signed)
In room to assess pt and pt talking about how long he has been here and that he is not wanting to wait any long. Pt has already been seen by Dr Pershing Proud. Pt stating he would not have "even come here if my dad had not made me.". Pt walked out of room and to the lobby. Dr Pershing Proud aware.

## 2016-05-06 NOTE — ED Notes (Signed)
PT  VOL °

## 2016-05-06 NOTE — ED Notes (Signed)
Pt. Verbalizes understanding of d/c instructions and follow-up. VS stable and pain controlled per pt.  Pt. In NAD at time of d/c and denies further concerns regarding this visit. Pt. Stable at the time of departure from the unit, departing unit by the safest and most appropriate manner per that pt condition and limitations. Pt advised to return to the ED at any time for emergent concerns, or for new/worsening symptoms.   

## 2016-05-06 NOTE — ED Triage Notes (Signed)
Pt reports having racing thoughts - states he is under a lot of stress and that he needs some help - hx of schizophrenia/paranoia/anxiety - pt reports suicidal at this time - pt denies plan at this time - also c/o left ankle pain - states he went to [facility] last week and was told that 3 fractures had not healed correctly

## 2016-05-06 NOTE — ED Provider Notes (Signed)
[facility] Emergency Department Provider Note  ____________________________________________   First MD Initiated Contact with Patient 05/06/16 1912     (approximate)  I have reviewed the triage vital signs and the nursing notes.   HISTORY  Chief Complaint Suicidal    HPI Lucas Guerra is a 31 y.o. male who reports a past history of schizophrenia who presents for evaluation of being under stress, somewhat depressed, and having "brief" suicidal thoughts.  He says he has been under a lot of stress with both family and social problems and it just got worse tonight.  In other words it is been gradual in onset and became severe today but now he says he feels much better.  He states he was in a situation at around some people that were bad for him and he knew he needed to get some help and get out of that environment.  However he states that he does have a safe place to go and he feels much better.  He states that he knows he would never actually try to hurt himself and he does not even have a plan or have any idea how he would do so.  He denies having any firearms.  He says that he has had bad thoughts in the past that was many years ago.  He is also frustrated about his chronic ankle pain of the left ankle for which she saw Dr. Pershing Proud overnight last night.  He has been resting comfortably for the last several hours in the emergency department and getting a good nap.  He states he feels much better now and confirms that he does have a safe place to go.  His review of systems is negative as indicated below   Past Medical History:  Diagnosis Date  . Anxiety   . GERD (gastroesophageal reflux disease)   . Heart murmur   . Schizophrenia [facility])     Patient Active Problem List   Diagnosis Date Noted  . Closed displaced trimalleolar fracture of left lower leg 08/22/2015  . N&V (nausea and vomiting) 06/14/2015  . 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 Paranoid  schizophrenia (HCC) 11/02/2014  . Acid reflux 11/02/2014  . Chronic peptic ulcer without hemorrhage or perforation 11/02/2014  . Dysphagia 11/02/2014  . Disorder of mitral valve 11/02/2014  . Back ache 11/02/2014  . BP (high blood pressure) 11/02/2014    Past Surgical History:  Procedure Laterality Date  . ORIF ANKLE FRACTURE Right 08/19/15  . ORIF ANKLE FRACTURE Left 08/19/2015   Procedure: OPEN REDUCTION INTERNAL FIXATION (ORIF) ANKLE FRACTURE;  Surgeon: Christena Flake, MD;  Location: [facility] ORS;  Service: Orthopedics;  Laterality: Left;    Prior to Admission medications   Medication Sig Start Date End Date Taking? Authorizing Provider  citalopram (CELEXA) 20 MG tablet TAKE 1 TABLET (20 MG TOTAL) BY MOUTH DAILY. 10/11/15   Reubin Milan, MD  doxepin (SINEQUAN) 10 MG capsule Take 1 capsule by mouth at bedtime as needed (for sleep.).  10/24/14   Historical Provider, MD  naproxen (NAPROSYN) 500 MG tablet Take 1 tablet (500 mg total) by mouth 2 (two) times daily with a meal. 12/04/15   Tommi Rumps, PA-C  ondansetron (ZOFRAN) 4 MG tablet Take 1 tablet (4 mg total) by mouth every 8 (eight) hours as needed for nausea or vomiting. 05/30/15   Phineas Semen, MD  sucralfate (CARAFATE) 1 g tablet TAKE 1 TABLET (1 G TOTAL) BY MOUTH 4 (FOUR) TIMES DAILY. 09/21/15  Reubin Milan, MD    Allergies Tramadol  Family History  Problem Relation Age of Onset  . Hypertension Mother   . Diabetes Father     Social History Social History  Substance Use Topics  . Smoking status: Current Every Day Smoker    Packs/day: 2.00    Years: 12.00    Types: Cigarettes  . Smokeless tobacco: Never Used  . Alcohol use No     Comment: rare consumption    Review of Systems Constitutional: No fever/chills Eyes: No visual changes. ENT: No sore throat. Cardiovascular: Denies chest pain. Respiratory: Denies shortness of breath. Gastrointestinal: No abdominal pain.  No nausea, no vomiting.  No diarrhea.  No  constipation. Genitourinary: Negative for dysuria. Musculoskeletal: Negative for back pain. Skin: Negative for rash. Neurological: Negative for headaches, focal weakness or numbness. Psych:  "dealing with a lot of stress", brief thoughts of hurting himself today  10-point ROS otherwise negative.  ____________________________________________   PHYSICAL EXAM:  VITAL SIGNS: ED Triage Vitals  Enc Vitals Group     BP 05/06/16 1656 (!) 123/91     Pulse Rate 05/06/16 1656 83     Resp 05/06/16 1656 15     Temp 05/06/16 1656 98.5 F (36.9 C)     Temp Source 05/06/16 1656 Oral     SpO2 05/06/16 1656 100 %     Weight 05/06/16 1657 140 lb (63.5 kg)     Height 05/06/16 1657 5\' 6"  (1.676 m)     Head Circumference --      Peak Flow --      Pain Score 05/06/16 1657 8     Pain Loc --      Pain Edu? --      Excl. in GC? --     Constitutional: Alert and oriented. Well appearing and in no acute distress. Eyes: Conjunctivae are normal. PERRL. EOMI. Head: Atraumatic. Nose: No congestion/rhinnorhea. Mouth/Throat: Mucous membranes are moist. Neck: No stridor.  No meningeal signs.   Cardiovascular: Normal rate, regular rhythm. Good peripheral circulation. Grossly normal heart sounds. Respiratory: Normal respiratory effort.  No retractions. Lungs CTAB. Gastrointestinal: Soft and nontender. No distention.  Musculoskeletal: Trace swelling in left ankle with mild tenderness.  No other gross deformities of extremities.  Chronic left ankle pain. Neurologic:  Normal speech and language. No gross focal neurologic deficits are appreciated.  Skin:  Skin is warm, dry and intact. No rash noted. Psychiatric: Mood and affect are normal. Speech and behavior are normal.    ____________________________________________   LABS (all labs ordered are listed, but only abnormal results are displayed)  Labs Reviewed  COMPREHENSIVE METABOLIC PANEL - Abnormal; Notable for the following:       Result Value    Glucose, Bld 102 (*)    ALT 14 (*)    All other components within normal limits  ACETAMINOPHEN LEVEL - Abnormal; Notable for the following:    Acetaminophen (Tylenol), Serum <10 (*)    All other components within normal limits  ETHANOL  SALICYLATE LEVEL  CBC  URINE DRUG SCREEN, QUALITATIVE ([facility] ONLY)   ____________________________________________  EKG  None - EKG not ordered by ED physician ____________________________________________  RADIOLOGY   Dg Ankle Complete Left  Result Date: 05/06/2016 CLINICAL DATA:  31 y/o  M; pain after twisting ankle. EXAM: LEFT ANKLE COMPLETE - 3+ VIEW COMPARISON:  12/04/2015 left ankle radiographs. FINDINGS: Interval callus formation of medial malleolar and lower fibular fractures. No new acute fracture identified. Fixation plates and  screws appear intact without apparent hardware related complication. IMPRESSION: No acute fracture or dislocation identified. No apparent hardware related complication. Interval callus formation of medial malleolar and lower fibular fractures. Electronically Signed   By: Mitzi Hansen M.D.   On: 05/06/2016 03:46    ____________________________________________   PROCEDURES  Procedure(s) performed:   Procedures   Critical Care performed: No ____________________________________________   INITIAL IMPRESSION / ASSESSMENT AND PLAN / ED COURSE  Pertinent labs & imaging results that were available during my care of the patient were reviewed by me and considered in my medical decision making (see chart for details).  Patient is well-appearing, NAD, calm and cooperative.  Shows good insight and judgment.  No pressured nor tangential speech.  No indication he is responding to internal stimuli.  Contracts for safety, confirms he has a safe place to go.  Does not meet inpatient nor IVC criteria.  He is comfortable with the plan to follow up at [facility].  Gave usual/customary return precautions.       ____________________________________________  FINAL CLINICAL IMPRESSION(S) / ED DIAGNOSES  Final diagnoses:  Depression, unspecified depression type  Chronic pain of left ankle     MEDICATIONS GIVEN DURING THIS VISIT:  Medications - No data to display   NEW OUTPATIENT MEDICATIONS STARTED DURING THIS VISIT:  New Prescriptions   No medications on file    Modified Medications   No medications on file    Discontinued Medications   No medications on file     Note:  This document was prepared using Dragon voice recognition software and may include unintentional dictation errors.    Loleta Rose, MD 05/06/16 1944

## 2016-05-06 NOTE — ED Notes (Signed)
Pt unable to urinate at this time.  

## 2016-05-06 NOTE — ED Triage Notes (Signed)
Pt arrives to triage for c/o left ankle pain. Pt states that he has pins in his ankle from a previous injury and that he has not been able to sleep due to pain x1 week. Pt states that during incarceration a few months ago, he fell down a flight of steps and injured his ankle. Pt is in NAD at this time.

## 2016-05-06 NOTE — Discharge Instructions (Signed)
You have been seen in the Emergency Department (ED)  today for psychiatric issues.  You have been evaluated and are being referred to:  [facility] [address] [place], [place] [address] Phone:  [phone] or [phone]  Open Access:   Walk-in ASSESSMENT hours, M-W-F, 8:00am - 3:00pm Advanced Acess CRISIS:  M-F, 8:00am - 8:00pm Outpatient Services Office Hours:  M-F, 8:00am - 5:00pm  Please return to the Emergency Department (ED)  immediately if you have ANY thoughts of hurting yourself or anyone else, so that we may help you.  Please avoid alcohol and drug use.  Follow up with your doctor and/or therapist as soon as possible regarding today's ED  visit.   Please follow up any other recommendations and clinic appointments provided by the psychiatry team that saw you in the Emergency Department.

## 2016-05-06 NOTE — ED Provider Notes (Signed)
[facility] Emergency Department Provider Note  ____________________________________________   First MD Initiated Contact with Patient 05/06/16 [phone]     (approximate)  I have reviewed the triage vital signs and the nursing notes.   HISTORY  Chief Complaint Ankle Pain   HPI Lucas Guerra is a 31 y.o. male with a history of a closed, displaced trimalleolar fracture of the left ankle who is presenting to the emergency department todayafter twisting his ankle twice this past week. He says that he twisted his ankle once yesterday. He says that he moved ankle to the "wrong direction" causing him pain diffusely to the ankle. He says that he has been using ibuprofen but without any relief. He has presented to the emergency department today for further evaluation.   Past Medical History:  Diagnosis Date  . Anxiety   . GERD (gastroesophageal reflux disease)   . Heart murmur     Patient Active Problem List   Diagnosis Date Noted  . Closed displaced trimalleolar fracture of left lower leg 08/22/2015  . N&V (nausea and vomiting) 06/14/2015  . 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 Paranoid schizophrenia (HCC) 11/02/2014  . Acid reflux 11/02/2014  . Chronic peptic ulcer without hemorrhage or perforation 11/02/2014  . Dysphagia 11/02/2014  . Disorder of mitral valve 11/02/2014  . Back ache 11/02/2014  . BP (high blood pressure) 11/02/2014    Past Surgical History:  Procedure Laterality Date  . ORIF ANKLE FRACTURE Right 08/19/15  . ORIF ANKLE FRACTURE Left 08/19/2015   Procedure: OPEN REDUCTION INTERNAL FIXATION (ORIF) ANKLE FRACTURE;  Surgeon: Christena Flake, MD;  Location: [facility] ORS;  Service: Orthopedics;  Laterality: Left;    Prior to Admission medications   Medication Sig Start Date End Date Taking? Authorizing Provider  citalopram (CELEXA) 20 MG tablet TAKE 1 TABLET (20 MG TOTAL) BY MOUTH DAILY. 10/11/15   Reubin Milan, MD  doxepin (SINEQUAN) 10 MG  capsule Take 1 capsule by mouth at bedtime as needed (for sleep.).  10/24/14   Historical Provider, MD  naproxen (NAPROSYN) 500 MG tablet Take 1 tablet (500 mg total) by mouth 2 (two) times daily with a meal. 12/04/15   Tommi Rumps, PA-C  ondansetron (ZOFRAN) 4 MG tablet Take 1 tablet (4 mg total) by mouth every 8 (eight) hours as needed for nausea or vomiting. 05/30/15   Phineas Semen, MD  sucralfate (CARAFATE) 1 g tablet TAKE 1 TABLET (1 G TOTAL) BY MOUTH 4 (FOUR) TIMES DAILY. 09/21/15   Reubin Milan, MD    Allergies Tramadol  Family History  Problem Relation Age of Onset  . Hypertension Mother   . Diabetes Father     Social History Social History  Substance Use Topics  . Smoking status: Current Every Day Smoker    Packs/day: 2.00    Years: 12.00    Types: Cigarettes  . Smokeless tobacco: Never Used  . Alcohol use No     Comment: rare consumption    Review of Systems Constitutional: No fever/chills Eyes: No visual changes. ENT: No sore throat. Cardiovascular: Denies chest pain. Respiratory: Denies shortness of breath. Gastrointestinal: No abdominal pain.  No nausea, no vomiting.  No diarrhea.  No constipation. Genitourinary: Negative for dysuria. Musculoskeletal: Negative for back pain. Skin: Negative for rash. Neurological: Negative for headaches, focal weakness or numbness.  10-point ROS otherwise negative.  ____________________________________________   PHYSICAL EXAM:  VITAL SIGNS: ED Triage Vitals  Enc Vitals Group     BP 05/06/16  0257 119/85     Pulse Rate 05/06/16 0257 (!) 58     Resp 05/06/16 0257 18     Temp 05/06/16 0257 98.1 F (36.7 C)     Temp Source 05/06/16 0257 Oral     SpO2 05/06/16 0257 99 %     Weight 05/06/16 0257 135 lb (61.2 kg)     Height 05/06/16 0257 5\' 6"  (1.676 m)     Head Circumference --      Peak Flow --      Pain Score 05/06/16 0312 9     Pain Loc --      Pain Edu? --      Excl. in GC? --     Constitutional: Alert  and oriented. Well appearing and in no acute distress. Eyes: Conjunctivae are normal. Head: Atraumatic. Nose: No congestion/rhinnorhea. Mouth/Throat: Mucous membranes are moist.   Neck: No stridor.   Cardiovascular: Normal rate, regular rhythm. Grossly normal heart sounds.  Good peripheral circulation with brisk capillary refills of the bilateral toes. Respiratory: Normal respiratory effort.  No retractions. Lungs CTAB. Gastrointestinal:  No distention. Musculoskeletal: Left ankle with diffuse swelling but without any pitting edema. Very mild tenderness throughout. Patient says the swelling has increased from baseline. No erythema, heat or induration.. Neurologic:  Normal speech and language. No gross focal neurologic deficits are appreciated. No gait instability. Skin:  Skin is warm, dry and intact. No rash noted. Psychiatric: Mood and affect are normal. Speech and behavior are normal.  ____________________________________________   LABS (all labs ordered are listed, but only abnormal results are displayed)  Labs Reviewed - No data to display ____________________________________________  EKG   ____________________________________________  RADIOLOGY  DG Ankle Complete Left (Final result)  Result time 05/06/16 03:46:47  Final result by Mitzi Hansen, MD (05/06/16 03:46:47)           Narrative:   CLINICAL DATA: 31 y/o M; pain after twisting ankle.  EXAM: LEFT ANKLE COMPLETE - 3+ VIEW  COMPARISON: 12/04/2015 left ankle radiographs.  FINDINGS: Interval callus formation of medial malleolar and lower fibular fractures. No new acute fracture identified. Fixation plates and screws appear intact without apparent hardware related complication.  IMPRESSION: No acute fracture or dislocation identified. No apparent hardware related complication. Interval callus formation of medial malleolar and lower fibular fractures.   Electronically Signed By: Mitzi Hansen M.D. On: 05/06/2016 03:46            ____________________________________________   PROCEDURES  Procedure(s) performed:   Procedures  Critical Care performed:   ____________________________________________   INITIAL IMPRESSION / ASSESSMENT AND PLAN / ED COURSE  Pertinent labs & imaging results that were available during my care of the patient were reviewed by me and considered in my medical decision making (see chart for details).  Discussed the radiology results with the patient. We discussed using an Ace wrap for compression as well as elevating the ankle and following up with orthopedics. The patient has a tramadol allergy and a total my would not be able to prescribe him anything stronger because of no finding of acute fracture. He went on to say how much his ankle has been hurting him ever since he had the surgery to fix it and that he was going to leave the hospital. Before he was able to be given discharge papers or counseled further he eloped. We did discuss follow-up with orthopedics and he says that he has going for further imaging this Friday. Patient did not appear acutely psychotic.  He was clinically sober and had capacity to make medical decisions.      ____________________________________________   FINAL CLINICAL IMPRESSION(S) / ED DIAGNOSES  Ankle sprain.    NEW MEDICATIONS STARTED DURING THIS VISIT:  New Prescriptions   No medications on file     Note:  This document was prepared using Dragon voice recognition software and may include unintentional dictation errors.    Myrna Blazer, MD 05/06/16 [phone]

## 2016-05-10 ENCOUNTER — Ambulatory Visit
Admission: RE | Admit: 2016-05-10 | Discharge: 2016-05-10 | Disposition: A | Discharge status: Home / Self Care | Payer: Medicare Other | Source: Ambulatory Visit | Attending: Student | Admitting: Student

## 2016-05-10 DIAGNOSIS — Z8781 Personal history of (healed) traumatic fracture: Secondary | ICD-10-CM | POA: Diagnosis not present

## 2016-05-10 DIAGNOSIS — S82852K Displaced trimalleolar fracture of left lower leg, subsequent encounter for closed fracture with nonunion: Secondary | ICD-10-CM | POA: Insufficient documentation

## 2016-05-10 DIAGNOSIS — S82832A Other fracture of upper and lower end of left fibula, initial encounter for closed fracture: Secondary | ICD-10-CM | POA: Diagnosis not present

## 2016-05-10 DIAGNOSIS — M25572 Pain in left ankle and joints of left foot: Secondary | ICD-10-CM | POA: Diagnosis present

## 2016-05-10 DIAGNOSIS — X58XXXD Exposure to other specified factors, subsequent encounter: Secondary | ICD-10-CM | POA: Diagnosis not present

## 2016-05-10 DIAGNOSIS — Z9889 Other specified postprocedural states: Secondary | ICD-10-CM

## 2016-05-10 DIAGNOSIS — G8929 Other chronic pain: Secondary | ICD-10-CM

## 2016-05-10 DIAGNOSIS — Z967 Presence of other bone and tendon implants: Secondary | ICD-10-CM | POA: Diagnosis not present

## 2016-05-17 DIAGNOSIS — S82852K Displaced trimalleolar fracture of left lower leg, subsequent encounter for closed fracture with nonunion: Secondary | ICD-10-CM | POA: Diagnosis not present

## 2016-05-28 ENCOUNTER — Inpatient Hospital Stay
Admission: RE | Admit: 2016-05-28 | Discharge: 2016-05-28 | Disposition: A | Discharge status: Home / Self Care | Payer: Medicare Other | Source: Ambulatory Visit

## 2016-05-31 ENCOUNTER — Inpatient Hospital Stay: Admission: RE | Admit: 2016-05-31 | Payer: Medicare Other | Source: Ambulatory Visit

## 2016-06-03 ENCOUNTER — Ambulatory Visit
Admission: RE | Admit: 2016-06-03 | Discharge: 2016-06-03 | Disposition: A | Discharge status: Home / Self Care | Payer: Medicare Other | Source: Ambulatory Visit | Attending: Surgery | Admitting: Surgery

## 2016-06-03 DIAGNOSIS — Z01818 Encounter for other preprocedural examination: Secondary | ICD-10-CM | POA: Insufficient documentation

## 2016-06-03 HISTORY — DX: Plantar wart: B07.0

## 2016-06-03 NOTE — Patient Instructions (Signed)
  Your procedure is scheduled on: Tuesday, June 04, 2016 at 1:00 pm Report to Same Day Surgery 2nd floor medical mall (Medical Mall Entrance-take elevator on left to 2nd floor.  Check in with surgery information desk.)   Remember: Instructions that are not followed completely may result in serious medical risk, up to and including death, or upon the discretion of your surgeon and anesthesiologist your surgery may need to be rescheduled.    _x___ 1. Do not eat food or drink liquids after midnight. No gum chewing or hard candies.     __x__ 2. No Alcohol for 24 hours before or after surgery.   __x__3. No Smoking for 24 prior to surgery.   ____  4. Bring all medications with you on the day of surgery if instructed.    __x__ 5. Notify your doctor if there is any change in your medical condition     (cold, fever, infections).     Do not wear jewelry, make-up, hairpins, clips or nail polish.  Do not wear lotions, powders, or perfumes. You may wear deodorant.  Do not shave 48 hours prior to surgery. Men may shave face and neck.  Do not bring valuables to the hospital.    [facility] is not responsible for any belongings or valuables.               Contacts, dentures or bridgework may not be worn into surgery.     Patients discharged the day of surgery will not be allowed to drive home.  You will need someone to drive you home and stay with you the night of your procedure.    Please read over the following fact sheets that you were given:   [facility] Preparing for Surgery and or MRSA Information   _x___ Take anti-hypertensive (unless it includes a diuretic), cardiac, seizure, asthma,anti-reflux and psychiatric medicines.  Take these medications with a SIP OF WATER ONLY. These include:   1. CELEXA  2.  3.  4.  5.  6.  ____Fleets enema or Magnesium Citrate as directed.   _x___ Use CHG Soap or sage wipes as directed on instruction sheet   ____ Use inhalers on the day of surgery and  bring to hospital day of surgery  ____ Stop Metformin and Janumet 2 days prior to surgery.    ____ Take 1/2 of usual insulin dose the night before surgery and none on the morning     surgery.   ____ Follow recommendations from Cardiologist, Pulmonologist or PCP regarding  stopping Aspirin, Coumadin, Pllavix ,Eliquis, Effient, or Pradaxa, and Pletal.  X____Stop Anti-inflammatories such as Advil, Aleve, Ibuprofen, Motrin, Naproxen, Naprosyn, Goodies powders or aspirin products. OK to take Tylenol and Celebrex.   ____ Stop supplements until after surgery.  But may continue Vitamin D, Vitamin B, and multivitamin.   ____ Bring C-Pap to the hospital.

## 2016-06-04 ENCOUNTER — Ambulatory Visit: Payer: Medicare Other | Admitting: Anesthesiology

## 2016-06-04 ENCOUNTER — Encounter: Payer: Self-pay | Admitting: *Deleted

## 2016-06-04 ENCOUNTER — Ambulatory Visit: Payer: Medicare Other

## 2016-06-04 ENCOUNTER — Ambulatory Visit
Admission: RE | Admit: 2016-06-04 | Discharge: 2016-06-04 | Disposition: A | Discharge status: Home / Self Care | Payer: Medicare Other | Source: Ambulatory Visit | Attending: Surgery | Admitting: Surgery

## 2016-06-04 ENCOUNTER — Encounter
Admission: RE | Disposition: A | Discharge status: Home / Self Care | Payer: Self-pay | Source: Ambulatory Visit | Attending: Surgery

## 2016-06-04 DIAGNOSIS — Z791 Long term (current) use of non-steroidal anti-inflammatories (NSAID): Secondary | ICD-10-CM | POA: Diagnosis not present

## 2016-06-04 DIAGNOSIS — F17210 Nicotine dependence, cigarettes, uncomplicated: Secondary | ICD-10-CM | POA: Insufficient documentation

## 2016-06-04 DIAGNOSIS — Y838 Other surgical procedures as the cause of abnormal reaction of the patient, or of later complication, without mention of misadventure at the time of the procedure: Secondary | ICD-10-CM | POA: Diagnosis not present

## 2016-06-04 DIAGNOSIS — Z967 Presence of other bone and tendon implants: Secondary | ICD-10-CM | POA: Diagnosis not present

## 2016-06-04 DIAGNOSIS — F419 Anxiety disorder, unspecified: Secondary | ICD-10-CM | POA: Diagnosis not present

## 2016-06-04 DIAGNOSIS — Z472 Encounter for removal of internal fixation device: Secondary | ICD-10-CM | POA: Diagnosis not present

## 2016-06-04 DIAGNOSIS — S82855K Nondisplaced trimalleolar fracture of left lower leg, subsequent encounter for closed fracture with nonunion: Secondary | ICD-10-CM | POA: Diagnosis not present

## 2016-06-04 DIAGNOSIS — Z8781 Personal history of (healed) traumatic fracture: Secondary | ICD-10-CM | POA: Diagnosis not present

## 2016-06-04 DIAGNOSIS — T8484XA Pain due to internal orthopedic prosthetic devices, implants and grafts, initial encounter: Secondary | ICD-10-CM | POA: Insufficient documentation

## 2016-06-04 DIAGNOSIS — I10 Essential (primary) hypertension: Secondary | ICD-10-CM | POA: Insufficient documentation

## 2016-06-04 DIAGNOSIS — K219 Gastro-esophageal reflux disease without esophagitis: Secondary | ICD-10-CM | POA: Diagnosis not present

## 2016-06-04 DIAGNOSIS — S82852K Displaced trimalleolar fracture of left lower leg, subsequent encounter for closed fracture with nonunion: Secondary | ICD-10-CM | POA: Insufficient documentation

## 2016-06-04 DIAGNOSIS — Z419 Encounter for procedure for purposes other than remedying health state, unspecified: Secondary | ICD-10-CM

## 2016-06-04 HISTORY — PX: HARDWARE REMOVAL: SHX979

## 2016-06-04 SURGERY — REMOVAL, HARDWARE
Anesthesia: General | Laterality: Left

## 2016-06-04 MED ORDER — OXYCODONE HCL 5 MG PO TABS
5.0000 mg | ORAL_TABLET | Freq: Once | ORAL | Status: AC | PRN
  Administered 2016-06-04: 5 mg via ORAL

## 2016-06-04 MED ORDER — FENTANYL CITRATE (PF) 100 MCG/2ML IJ SOLN
INTRAMUSCULAR | Status: AC
  Filled 2016-06-04: qty 2

## 2016-06-04 MED ORDER — DEXAMETHASONE SODIUM PHOSPHATE 10 MG/ML IJ SOLN
INTRAMUSCULAR | Status: DC | PRN
Start: 2016-06-04 — End: 2016-06-04
  Administered 2016-06-04: 10 mg via INTRAVENOUS

## 2016-06-04 MED ORDER — PROPOFOL 10 MG/ML IV BOLUS
INTRAVENOUS | Status: AC
  Filled 2016-06-04: qty 20

## 2016-06-04 MED ORDER — SUCCINYLCHOLINE CHLORIDE 20 MG/ML IJ SOLN
INTRAMUSCULAR | Status: DC | PRN
  Administered 2016-06-04: 120 mg via INTRAVENOUS

## 2016-06-04 MED ORDER — OXYCODONE HCL 5 MG/5ML PO SOLN: 5.0000 mg | Freq: Once | ORAL | Status: AC | PRN

## 2016-06-04 MED ORDER — OXYCODONE HCL 5 MG PO TABS: 5.0000 mg | ORAL_TABLET | ORAL | 0 refills | Status: DC | PRN

## 2016-06-04 MED ORDER — METOCLOPRAMIDE HCL 5 MG/ML IJ SOLN
INTRAMUSCULAR | Status: DC | PRN
  Administered 2016-06-04: 10 mg via INTRAVENOUS

## 2016-06-04 MED ORDER — MIDAZOLAM HCL 2 MG/2ML IJ SOLN
INTRAMUSCULAR | Status: AC
  Filled 2016-06-04: qty 2

## 2016-06-04 MED ORDER — MIDAZOLAM HCL 2 MG/2ML IJ SOLN
INTRAMUSCULAR | Status: DC | PRN
  Administered 2016-06-04: 2 mg via INTRAVENOUS

## 2016-06-04 MED ORDER — ONDANSETRON HCL 4 MG/2ML IJ SOLN
INTRAMUSCULAR | Status: AC
  Filled 2016-06-04: qty 2

## 2016-06-04 MED ORDER — ACETAMINOPHEN 10 MG/ML IV SOLN
INTRAVENOUS | Status: DC | PRN
  Administered 2016-06-04: 1000 mg via INTRAVENOUS

## 2016-06-04 MED ORDER — METOCLOPRAMIDE HCL 5 MG/ML IJ SOLN
INTRAMUSCULAR | Status: AC
  Filled 2016-06-04: qty 2

## 2016-06-04 MED ORDER — BUPIVACAINE HCL (PF) 0.5 % IJ SOLN
INTRAMUSCULAR | Status: DC | PRN
  Administered 2016-06-04: 20 mL

## 2016-06-04 MED ORDER — FENTANYL CITRATE (PF) 100 MCG/2ML IJ SOLN
25.0000 ug | INTRAMUSCULAR | Status: DC | PRN
  Administered 2016-06-04 (×2): 25 ug via INTRAVENOUS

## 2016-06-04 MED ORDER — LACTATED RINGERS IV SOLN
INTRAVENOUS | Status: DC
  Administered 2016-06-04: 13:00:00 via INTRAVENOUS

## 2016-06-04 MED ORDER — FAMOTIDINE 20 MG PO TABS
20.0000 mg | ORAL_TABLET | Freq: Once | ORAL | Status: AC
  Administered 2016-06-04: 20 mg via ORAL

## 2016-06-04 MED ORDER — FENTANYL CITRATE (PF) 100 MCG/2ML IJ SOLN
INTRAMUSCULAR | Status: DC | PRN
  Administered 2016-06-04 (×2): 50 ug via INTRAVENOUS
  Administered 2016-06-04: 100 ug via INTRAVENOUS

## 2016-06-04 MED ORDER — CEFAZOLIN SODIUM-DEXTROSE 2-4 GM/100ML-% IV SOLN
INTRAVENOUS | Status: AC
  Filled 2016-06-04: qty 100

## 2016-06-04 MED ORDER — FAMOTIDINE 20 MG PO TABS
ORAL_TABLET | ORAL | Status: AC
  Filled 2016-06-04: qty 1

## 2016-06-04 MED ORDER — FENTANYL CITRATE (PF) 100 MCG/2ML IJ SOLN
INTRAMUSCULAR | Status: AC
  Administered 2016-06-04: 25 ug via INTRAVENOUS
  Filled 2016-06-04: qty 2

## 2016-06-04 MED ORDER — BUPIVACAINE HCL (PF) 0.5 % IJ SOLN
INTRAMUSCULAR | Status: AC
  Filled 2016-06-04: qty 30

## 2016-06-04 MED ORDER — ACETAMINOPHEN 10 MG/ML IV SOLN
INTRAVENOUS | Status: AC
Start: 2016-06-04 — End: 2016-06-04
  Filled 2016-06-04: qty 100

## 2016-06-04 MED ORDER — NEOMYCIN-POLYMYXIN B GU 40-200000 IR SOLN
Status: DC | PRN
  Administered 2016-06-04: 4 mL

## 2016-06-04 MED ORDER — CEFAZOLIN SODIUM-DEXTROSE 2-4 GM/100ML-% IV SOLN
2.0000 g | Freq: Once | INTRAVENOUS | Status: AC
  Administered 2016-06-04: 2 g via INTRAVENOUS

## 2016-06-04 MED ORDER — NEOMYCIN-POLYMYXIN B GU 40-200000 IR SOLN
Status: AC
  Filled 2016-06-04: qty 4

## 2016-06-04 MED ORDER — OXYCODONE HCL 5 MG PO TABS
ORAL_TABLET | ORAL | Status: AC
  Filled 2016-06-04: qty 1

## 2016-06-04 MED ORDER — PROPOFOL 10 MG/ML IV BOLUS
INTRAVENOUS | Status: DC | PRN
  Administered 2016-06-04: 150 mg via INTRAVENOUS

## 2016-06-04 MED ORDER — ONDANSETRON HCL 4 MG/2ML IJ SOLN
INTRAMUSCULAR | Status: DC | PRN
  Administered 2016-06-04: 4 mg via INTRAVENOUS

## 2016-06-04 MED ORDER — SUCCINYLCHOLINE CHLORIDE 20 MG/ML IJ SOLN
INTRAMUSCULAR | Status: AC
  Filled 2016-06-04: qty 1

## 2016-06-04 MED ORDER — DEXAMETHASONE SODIUM PHOSPHATE 10 MG/ML IJ SOLN
INTRAMUSCULAR | Status: AC
  Filled 2016-06-04: qty 1

## 2016-06-04 SURGICAL SUPPLY — 52 items
BANDAGE ACE 4X5 VEL STRL LF (GAUZE/BANDAGES/DRESSINGS) ×6 IMPLANT
BANDAGE ACE 6X5 VEL STRL LF (GAUZE/BANDAGES/DRESSINGS) ×3 IMPLANT
BLADE SURG SZ10 CARB STEEL (BLADE) ×6 IMPLANT
BNDG COHESIVE 4X5 TAN STRL (GAUZE/BANDAGES/DRESSINGS) ×3 IMPLANT
BNDG ESMARK 6X12 TAN STRL LF (GAUZE/BANDAGES/DRESSINGS) ×3 IMPLANT
BNDG PLASTER FAST 4X5 WHT LF (CAST SUPPLIES) IMPLANT
CANISTER SUCT 1200ML W/VALVE (MISCELLANEOUS) ×3 IMPLANT
CHLORAPREP W/TINT 26ML (MISCELLANEOUS) ×3 IMPLANT
CUFF TOURN 18 STER (MISCELLANEOUS) ×3 IMPLANT
CUFF TOURN 24 STER (MISCELLANEOUS) IMPLANT
CUFF TOURN 30 STER DUAL PORT (MISCELLANEOUS) IMPLANT
DRAPE C-ARM XRAY 36X54 (DRAPES) ×3 IMPLANT
DRAPE C-ARMOR (DRAPES) ×3 IMPLANT
DRAPE FLUOR MINI C-ARM 54X84 (DRAPES) IMPLANT
DRAPE INCISE IOBAN 66X45 STRL (DRAPES) ×3 IMPLANT
DRAPE U-SHAPE 47X51 STRL (DRAPES) ×3 IMPLANT
ELECT CAUTERY BLADE 6.4 (BLADE) ×3 IMPLANT
ELECT REM PT RETURN 9FT ADLT (ELECTROSURGICAL) ×3
ELECTRODE REM PT RTRN 9FT ADLT (ELECTROSURGICAL) ×1 IMPLANT
GAUZE PETRO XEROFOAM 1X8 (MISCELLANEOUS) ×3 IMPLANT
GAUZE SPONGE 4X4 12PLY STRL (GAUZE/BANDAGES/DRESSINGS) ×3 IMPLANT
GLOVE BIO SURGEON STRL SZ8 (GLOVE) ×6 IMPLANT
GLOVE INDICATOR 8.0 STRL GRN (GLOVE) ×3 IMPLANT
GOWN STRL REUS W/ TWL LRG LVL3 (GOWN DISPOSABLE) ×1 IMPLANT
GOWN STRL REUS W/ TWL XL LVL3 (GOWN DISPOSABLE) ×1 IMPLANT
GOWN STRL REUS W/TWL LRG LVL3 (GOWN DISPOSABLE) ×2
GOWN STRL REUS W/TWL XL LVL3 (GOWN DISPOSABLE) ×2
HEMOVAC 400ML (MISCELLANEOUS) ×3
K-WIRE ACE 1.6X6 (WIRE) ×3
KIT DRAIN HEMOVAC JP 7FR 400ML (MISCELLANEOUS) ×1 IMPLANT
KIT RM TURNOVER STRD PROC AR (KITS) ×3 IMPLANT
KWIRE ACE 1.6X6 (WIRE) ×1 IMPLANT
LABEL OR SOLS (LABEL) IMPLANT
NEEDLE FILTER BLUNT 18X 1/2SAF (NEEDLE) ×2
NEEDLE FILTER BLUNT 18X1 1/2 (NEEDLE) ×1 IMPLANT
NS IRRIG 1000ML POUR BTL (IV SOLUTION) ×3 IMPLANT
PACK EXTREMITY ARMC (MISCELLANEOUS) ×3 IMPLANT
PAD ABD DERMACEA PRESS 5X9 (GAUZE/BANDAGES/DRESSINGS) ×6 IMPLANT
PAD CAST CTTN 4X4 STRL (SOFTGOODS) ×2 IMPLANT
PAD PREP 24X41 OB/GYN DISP (PERSONAL CARE ITEMS) ×3 IMPLANT
PADDING CAST COTTON 4X4 STRL (SOFTGOODS) ×4
PUTTY DBX 1CC (Putty) ×3 IMPLANT
PUTTY DBX 1CC DEPUY (Putty) ×1 IMPLANT
SPONGE LAP 18X18 5 PK (GAUZE/BANDAGES/DRESSINGS) ×3 IMPLANT
STAPLER SKIN PROX 35W (STAPLE) ×3 IMPLANT
STOCKINETTE IMPERV 14X48 (MISCELLANEOUS) ×3 IMPLANT
STOCKINETTE M/LG 89821 (MISCELLANEOUS) ×3 IMPLANT
SUT PROLENE 4 0 PS 2 18 (SUTURE) ×6 IMPLANT
SUT VIC AB 0 CT1 36 (SUTURE) ×3 IMPLANT
SUT VIC AB 2-0 SH 27 (SUTURE) ×4
SUT VIC AB 2-0 SH 27XBRD (SUTURE) ×2 IMPLANT
SYRINGE 10CC LL (SYRINGE) ×3 IMPLANT

## 2016-06-04 NOTE — Transfer of Care (Signed)
Immediate Anesthesia Transfer of Care Note  Patient: Lucas Guerra  Procedure(s) Performed: Procedure(s): HARDWARE REMOVAL WITH POSSIBLE BONE GRAFTING OF THE DISTAL FIBULAR FRACTURE (Left)  Patient Location: PACU  Anesthesia Type:General  Level of Consciousness: patient cooperative and lethargic  Airway & Oxygen Therapy: Patient Spontanous Breathing and Patient connected to face mask oxygen  Post-op Assessment: Report given to RN and Post -op Vital signs reviewed and stable  Post vital signs: Reviewed and stable  Last Vitals:  Vitals:   06/04/16 1306 06/04/16 1647  BP: 134/90 (!) 116/59  Pulse: 83 91  Resp: 16 (!) 27  Temp: 36.8 C 36.6 C    Last Pain:  Vitals:   06/04/16 1306  TempSrc: Tympanic  PainSc: 9          Complications: No apparent anesthesia complications

## 2016-06-04 NOTE — Discharge Instructions (Signed)
Keep splint dry and intact.  Apply ice frequently to ankle. Keep left ankle elevated above heart level. Take ibuprofen 800 mg TID with meals for 7-10 days, then as necessary. Take oxycodone as prescribed when needed.  May supplement with ES Tylenol if necessary. No weight-bearing on left leg - use crutches. Follow-up in 10-14 days or as scheduled.

## 2016-06-04 NOTE — Anesthesia Procedure Notes (Signed)
Procedure Name: Intubation Date/Time: 06/04/2016 3:12 PM Performed by: Omer Jack Pre-anesthesia Checklist: Patient identified, Patient being monitored, Timeout performed, Emergency Drugs available and Suction available Patient Re-evaluated:Patient Re-evaluated prior to inductionOxygen Delivery Method: Circle system utilized Preoxygenation: Pre-oxygenation with 100% oxygen Intubation Type: IV induction Ventilation: Mask ventilation without difficulty Laryngoscope Size: Miller and 2 Grade View: Grade I Tube type: Oral Tube size: 7.5 mm Number of attempts: 1 Airway Equipment and Method: Stylet Placement Confirmation: ETT inserted through vocal cords under direct vision,  positive ETCO2 and breath sounds checked- equal and bilateral Secured at: 23 cm Tube secured with: Tape Dental Injury: Teeth and Oropharynx as per pre-operative assessment

## 2016-06-04 NOTE — Op Note (Signed)
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  Post-Op Diagnosis:   Same.  Procedure:   Hardware removal of medial and lateral malleolar line with debridement and grafting of partial distal fibular nonunion, left ankle.  Surgeon:   Maryagnes Amos, MD  Assistant:   None  Anesthesia:   GET  Findings:   As above.  Complications:   None  EBL:   10 cc  Fluids:   750 cc crystalloid  UOP:   None  TT:   55 min at 250 mmHg  Drains:   None  Closure:   Staples  Implants:   None  Brief Clinical Note:   The patient is a 31 year old male who is now 9+ months status post an open reduction and internal fixation of his displaced bimalleolar fracture dislocation. Despite physical therapy, bracing, medications, etc., he continues to have pain in the medial and lateral aspects of the ankle. A CT scan suggested incomplete healing of both the medial malleolar and distal fibular fractures. Although bridging bone was present, there were areas where the bone was not bridging the fracture site, especially laterally. His history and examination were consistent with painful retained surgical hardware with possible symptomatic nonunions. The patient presents at this time for hardware removal, takedown of the distal fibular nonunion site, grafting of this site, and possible takedown and repeat internal fixation of the medial and/or lateral malleolar fractures.  Procedure:   The patient was brought into the operating room and lain in the supine position. After adequate general endotracheal intubation and anesthesia was obtained, the left foot and lower leg were prepped with ChloraPrep solution and draped sterilely. Preoperative antibiotics were administered. A timeout was performed to verify the appropriate surgical site before the limb was exsanguinated with  an Esmarch and the calf tourniquet inflated to 250 mmHg. medially, the previous incision was reopened and carried down through the subcutaneous tissues with care taken to identify and protect the saphenous nerve and vein. Deeper dissection was carried out at the inferior aspect of the medial malleolus. Both medial malleolar screw heads were identified and the screws removed sequentially with the appropriate screwdriver. The screw holes were abraded with a small narrow Therapist, nutritional. Under fluoroscopic visualization, the ankle was stressed medially and laterally, but there was no evidence of any motion at the medial malleolar fracture line.  Laterally, the previous 5-6 cm incision was reopened over the lateral aspect of the distal fibula. The incision was carried down through the subcutaneous tissues to expose the distal fibular plate. Utilizing the appropriate screwdrivers, all of the screws were removed before the plate itself was removed. Again the screw holes were lightly abraded with the Freer elevator and the fibrinous rind scraped off the distal fibula with a curet. The area of partial nonunion on the anterior aspect of the distal fibula at the fracture site was identified and debrided out using a #15 blade, curettes, and a small rongeur. The canal was reopened both proximally and distally using a 1.6 mm guidewire before the lateral wound was irrigated thoroughly with sterile saline solution. A total of 0.5 cc of DBX demineralized bone matrix was injected into the debrided area of the fracture site before the ankle again was stressed under fluoroscopy. There was no evidence of any motion at the distal fibular fracture site. The mortise was stable to  both inversion and eversion stressing, as well as with external rotation.   Laterally, the deeper fibrous tissues were reapproximated using #0 Vicryl interrupted sutures. The subcutaneous tissues were closed using 2-0 Vicryl interrupted sutures before the skin  was closed using staples. Medially, after irrigating this wound with sterile saline solution, the subcutaneous tissues were reapproximated using 2-0 Vicryl interrupted sutures before the skin was closed using staples. A total of 20 cc of 0.5% plain Sensorcaine was injected in and around the incision sites to help with postoperative analgesia. Sterile bulky dressings were applied to the wounds before the patient was placed into a posterior splint with a sugar tong supplement, maintaining the ankle in neutral dorsiflexion. The patient was then awakened, extubated, and returned to the recovery room in satisfactory condition after tolerating the procedure well.

## 2016-06-04 NOTE — H&P (Signed)
Paper H&P to be scanned into permanent record. H&P reviewed and patient re-examined. No changes. 

## 2016-06-04 NOTE — Anesthesia Post-op Follow-up Note (Cosign Needed)
Anesthesia QCDR form completed.        

## 2016-06-04 NOTE — Progress Notes (Signed)
Left foot elevated on 2 pillows  Can wiggle toes and ice to lft foot

## 2016-06-04 NOTE — Anesthesia Preprocedure Evaluation (Signed)
Anesthesia Evaluation  Patient identified by MRN, date of birth, ID band Patient awake    Reviewed: Allergy & Precautions, H&P , NPO status , Patient's Chart, lab work & pertinent test results  History of Anesthesia Complications Negative for: history of anesthetic complications  Airway Mallampati: III  TM Distance: >3 FB Neck ROM: full    Dental  (+) Poor Dentition, Chipped, Missing   Pulmonary neg shortness of breath, Current Smoker,    Pulmonary exam normal breath sounds clear to auscultation       Cardiovascular Exercise Tolerance: Good hypertension, (-) angina(-) Past MI and (-) PND Normal cardiovascular exam+ Valvular Problems/Murmurs  Rhythm:regular Rate:Normal     Neuro/Psych PSYCHIATRIC DISORDERS Anxiety Schizophrenia negative neurological ROS     GI/Hepatic Neg liver ROS, PUD, GERD  Controlled and Medicated,  Endo/Other  negative endocrine ROS  Renal/GU negative Renal ROS  negative genitourinary   Musculoskeletal   Abdominal   Peds  Hematology negative hematology ROS (+)   Anesthesia Other Findings Past Medical History:   Anxiety                                                      Heart murmur                                                 GERD (gastroesophageal reflux disease)                      Past Surgical History:   NO PAST SURGERIES                                            BMI    Body Mass Index   22.28 kg/m 2      Reproductive/Obstetrics negative OB ROS                             Anesthesia Physical  Anesthesia Plan  ASA: III  Anesthesia Plan: General ETT   Post-op Pain Management:    Induction:   Airway Management Planned:   Additional Equipment:   Intra-op Plan:   Post-operative Plan:   Informed Consent: I have reviewed the patients History and Physical, chart, labs and discussed the procedure including the risks, benefits and alternatives  for the proposed anesthesia with the patient or authorized representative who has indicated his/her understanding and acceptance.   Dental Advisory Given  Plan Discussed with: Anesthesiologist, CRNA and Surgeon  Anesthesia Plan Comments:         Anesthesia Quick Evaluation

## 2016-06-04 NOTE — Anesthesia Postprocedure Evaluation (Signed)
Anesthesia Post Note  Patient: Lucas Guerra  Procedure(s) Performed: Procedure(s) (LRB): HARDWARE REMOVAL WITH POSSIBLE BONE GRAFTING OF THE DISTAL FIBULAR FRACTURE (Left)  Patient location during evaluation: PACU Anesthesia Type: General Level of consciousness: awake and alert Pain management: pain level controlled Vital Signs Assessment: post-procedure vital signs reviewed and stable Respiratory status: spontaneous breathing, nonlabored ventilation, respiratory function stable and patient connected to nasal cannula oxygen Cardiovascular status: blood pressure returned to baseline and stable Postop Assessment: no signs of nausea or vomiting Anesthetic complications: no     Last Vitals:  Vitals:   06/04/16 1743 06/04/16 1800  BP: 127/71 108/82  Pulse: 61 81  Resp: 16   Temp: 36.4 C     Last Pain:  Vitals:   06/04/16 1800  TempSrc:   PainSc: 3                  Lenard Simmer

## 2016-06-05 ENCOUNTER — Encounter: Payer: Self-pay | Admitting: Surgery

## 2016-06-07 ENCOUNTER — Encounter: Payer: Self-pay | Admitting: Surgery

## 2016-06-10 DIAGNOSIS — Z9889 Other specified postprocedural states: Secondary | ICD-10-CM

## 2016-06-10 DIAGNOSIS — Z8781 Personal history of (healed) traumatic fracture: Secondary | ICD-10-CM | POA: Insufficient documentation

## 2016-06-14 DIAGNOSIS — S82852K Displaced trimalleolar fracture of left lower leg, subsequent encounter for closed fracture with nonunion: Secondary | ICD-10-CM | POA: Diagnosis not present

## 2016-06-14 DIAGNOSIS — M25572 Pain in left ankle and joints of left foot: Secondary | ICD-10-CM | POA: Diagnosis not present

## 2016-06-14 DIAGNOSIS — Z967 Presence of other bone and tendon implants: Secondary | ICD-10-CM | POA: Diagnosis not present

## 2016-06-14 DIAGNOSIS — Z9889 Other specified postprocedural states: Secondary | ICD-10-CM | POA: Diagnosis not present

## 2016-06-14 DIAGNOSIS — Z8781 Personal history of (healed) traumatic fracture: Secondary | ICD-10-CM | POA: Diagnosis not present

## 2016-07-02 DIAGNOSIS — Z967 Presence of other bone and tendon implants: Secondary | ICD-10-CM | POA: Diagnosis not present

## 2016-07-02 DIAGNOSIS — Z8781 Personal history of (healed) traumatic fracture: Secondary | ICD-10-CM | POA: Diagnosis not present

## 2016-07-09 DIAGNOSIS — Z967 Presence of other bone and tendon implants: Secondary | ICD-10-CM | POA: Diagnosis not present

## 2016-07-09 DIAGNOSIS — Z8781 Personal history of (healed) traumatic fracture: Secondary | ICD-10-CM | POA: Diagnosis not present

## 2016-07-16 DIAGNOSIS — M25572 Pain in left ankle and joints of left foot: Secondary | ICD-10-CM | POA: Diagnosis not present

## 2016-07-16 DIAGNOSIS — Z967 Presence of other bone and tendon implants: Secondary | ICD-10-CM | POA: Diagnosis not present

## 2016-07-26 DIAGNOSIS — Z8781 Personal history of (healed) traumatic fracture: Secondary | ICD-10-CM | POA: Diagnosis not present

## 2016-07-26 DIAGNOSIS — Z967 Presence of other bone and tendon implants: Secondary | ICD-10-CM | POA: Diagnosis not present

## 2016-08-19 ENCOUNTER — Emergency Department
Admission: EM | Admit: 2016-08-19 | Discharge: 2016-08-19 | Disposition: A | Discharge status: Home / Self Care | Payer: Medicare Other | Attending: Emergency Medicine | Admitting: Emergency Medicine

## 2016-08-19 ENCOUNTER — Emergency Department: Payer: Medicare Other

## 2016-08-19 DIAGNOSIS — Y939 Activity, unspecified: Secondary | ICD-10-CM | POA: Diagnosis not present

## 2016-08-19 DIAGNOSIS — S99912A Unspecified injury of left ankle, initial encounter: Secondary | ICD-10-CM | POA: Insufficient documentation

## 2016-08-19 DIAGNOSIS — Y929 Unspecified place or not applicable: Secondary | ICD-10-CM | POA: Insufficient documentation

## 2016-08-19 DIAGNOSIS — F17210 Nicotine dependence, cigarettes, uncomplicated: Secondary | ICD-10-CM | POA: Insufficient documentation

## 2016-08-19 DIAGNOSIS — M7989 Other specified soft tissue disorders: Secondary | ICD-10-CM | POA: Diagnosis not present

## 2016-08-19 DIAGNOSIS — Y999 Unspecified external cause status: Secondary | ICD-10-CM | POA: Insufficient documentation

## 2016-08-19 DIAGNOSIS — S90912A Unspecified superficial injury of left ankle, initial encounter: Secondary | ICD-10-CM | POA: Diagnosis not present

## 2016-08-19 MED ORDER — NAPROXEN 500 MG PO TABS: 500.0000 mg | ORAL_TABLET | Freq: Two times a day (BID) | ORAL | 0 refills | Status: DC
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
## 2016-09-16 ENCOUNTER — Other Ambulatory Visit: Payer: Self-pay | Admitting: Internal Medicine

## 2016-09-16 DIAGNOSIS — S82852A Displaced trimalleolar fracture of left lower leg, initial encounter for closed fracture: Secondary | ICD-10-CM

## 2017-02-26 ENCOUNTER — Ambulatory Visit: Payer: Medicare Other | Admitting: Internal Medicine

## 2017-04-25 ENCOUNTER — Ambulatory Visit: Payer: Medicare Other | Admitting: Internal Medicine

## 2017-05-02 ENCOUNTER — Encounter: Payer: Self-pay | Admitting: Internal Medicine

## 2017-05-02 ENCOUNTER — Ambulatory Visit: Payer: Medicare Other | Admitting: Internal Medicine

## 2017-05-05 ENCOUNTER — Encounter: Payer: Self-pay | Admitting: Internal Medicine

## 2017-05-05 ENCOUNTER — Ambulatory Visit (INDEPENDENT_AMBULATORY_CARE_PROVIDER_SITE_OTHER): Payer: Medicare Other | Admitting: Internal Medicine

## 2017-05-05 VITALS — BP 124/78 | HR 72 | Temp 98.2°F | Ht 66.0 in | Wt 143.0 lb

## 2017-05-05 DIAGNOSIS — Z9889 Other specified postprocedural states: Secondary | ICD-10-CM

## 2017-05-05 DIAGNOSIS — Z967 Presence of other bone and tendon implants: Secondary | ICD-10-CM | POA: Diagnosis not present

## 2017-05-05 DIAGNOSIS — F17200 Nicotine dependence, unspecified, uncomplicated: Secondary | ICD-10-CM

## 2017-05-05 DIAGNOSIS — F200 Paranoid schizophrenia: Secondary | ICD-10-CM

## 2017-05-05 DIAGNOSIS — J0100 Acute maxillary sinusitis, unspecified: Secondary | ICD-10-CM | POA: Diagnosis not present

## 2017-05-05 DIAGNOSIS — Z8781 Personal history of (healed) traumatic fracture: Secondary | ICD-10-CM | POA: Diagnosis not present

## 2017-05-05 MED ORDER — AMOXICILLIN-POT CLAVULANATE 875-125 MG PO TABS: 1.0000 | ORAL_TABLET | Freq: Two times a day (BID) | ORAL | 0 refills | Status: AC

## 2017-05-05 MED ORDER — BENZONATATE 100 MG PO CAPS: 100.0000 mg | ORAL_CAPSULE | Freq: Three times a day (TID) | ORAL | 0 refills | Status: DC

## 2017-05-05 NOTE — Progress Notes (Signed)
Date:  05/05/2017   Name:  Lucas Guerra   DOB:  April 30, 1985   MRN:  [id number]   Chief Complaint: lisence form (CBC in [place]. They discontinued him as patient. Needs to get back in with them to get back on medications. PHQ9- 19) and Cough (Hot and cold chills, coughing with wheezing. Green yellow mucous. No headache. X 2 days. )  Cough  This is a new problem. The current episode started yesterday. The problem has been unchanged. The problem occurs every few hours. The cough is productive of sputum. Associated symptoms include chills, a sore throat and wheezing. Pertinent negatives include no chest pain, ear pain, fever, headaches, shortness of breath, sweats or weight loss. Risk factors for lung disease include smoking/tobacco exposure. He has tried OTC cough suppressant for the symptoms. There is no history of environmental allergies.   Schizophrenia - currently not on medication.  He is planning to go to [facility] walk in later today to get established.  DMV form - pt has a DMV form to be completed.  The areas of concern are vision, psychosis and HTN.  He has never taken medications.  Ankle pain - has chronic pain in left ankle after surgery for fracture.  He was seeing [facility] would like to go elsewhere for follow up.  Review of Systems  Constitutional: Positive for chills. Negative for fever and weight loss.  HENT: Positive for sore throat. Negative for ear discharge, ear pain, hearing loss and tinnitus.   Eyes: Negative for visual disturbance.  Respiratory: Positive for cough and wheezing. Negative for shortness of breath.   Cardiovascular: Negative for chest pain, palpitations and leg swelling.  Gastrointestinal: Negative for abdominal pain.  Musculoskeletal: Positive for arthralgias (left ankle).  Allergic/Immunologic: Negative for environmental allergies.  Neurological: Negative for dizziness and headaches.  Psychiatric/Behavioral: Positive for dysphoric mood. Negative for  suicidal ideas.    Patient Active Problem List   Diagnosis Date Noted  . Status post ORIF of fracture of ankle 06/10/2016  . Closed displaced trimalleolar fracture of left lower leg 08/22/2015  . N&V (nausea and vomiting) 06/14/2015  . 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 Paranoid schizophrenia (HCC) 11/02/2014  . Acid reflux 11/02/2014  . Chronic peptic ulcer without hemorrhage or perforation 11/02/2014  . Dysphagia 11/02/2014  . Disorder of mitral valve 11/02/2014  . Back ache 11/02/2014  . BP (high blood pressure) 11/02/2014    Prior to Admission medications   Medication Sig Start Date End Date Taking? Authorizing Provider  citalopram (CELEXA) 20 MG tablet TAKE 1 TABLET (20 MG TOTAL) BY MOUTH DAILY. 10/11/15  Yes Reubin Milan, MD  ibuprofen (ADVIL,MOTRIN) 200 MG tablet Take 200 mg by mouth every 6 (six) hours as needed for pain.   Yes [provider]    Allergies  Allergen Reactions  . Tramadol Rash    Past Surgical History:  Procedure Laterality Date  . ESOPHAGOGASTRODUODENOSCOPY    . HARDWARE REMOVAL Left 06/04/2016   Procedure: HARDWARE REMOVAL WITH POSSIBLE BONE GRAFTING OF THE DISTAL FIBULAR FRACTURE;  Surgeon: Christena Flake, MD;  Location: [facility] ORS;  Service: Orthopedics;  Laterality: Left;  . ORIF ANKLE FRACTURE Right 08/19/15  . ORIF ANKLE FRACTURE Left 08/19/2015   Procedure: OPEN REDUCTION INTERNAL FIXATION (ORIF) ANKLE FRACTURE;  Surgeon: Christena Flake, MD;  Location: [facility] ORS;  Service: Orthopedics;  Laterality: Left;  . PLANTAR'S WART EXCISION Right     Social History   Tobacco Use  . Smoking status:  Current Every Day Smoker    Packs/day: 2.00    Years: 12.00    Pack years: 24.00    Types: Cigarettes  . Smokeless tobacco: Never Used  Substance Use Topics  . Alcohol use: No    Alcohol/week: 0.0 oz    Comment: rare consumption  . Drug use: No     Medication list has been reviewed and updated.  PHQ 2/9 Scores 05/05/2017 10/06/2015  PHQ  - 2 Score 6 0  PHQ- 9 Score 19 -    Physical Exam  Constitutional: He is oriented to person, place, and time. He appears well-developed and well-nourished.  HENT:  Right Ear: External ear and ear canal normal. Tympanic membrane is not erythematous and not retracted.  Left Ear: External ear and ear canal normal. Tympanic membrane is not erythematous and not retracted.  Nose: Right sinus exhibits maxillary sinus tenderness and frontal sinus tenderness. Left sinus exhibits maxillary sinus tenderness and frontal sinus tenderness.  Mouth/Throat: Uvula is midline and mucous membranes are normal. No oral lesions. Posterior oropharyngeal erythema present. No oropharyngeal exudate.  Cardiovascular: Normal rate, regular rhythm and normal heart sounds.  Pulmonary/Chest: Breath sounds normal. He has no wheezes. He has no rales.  Musculoskeletal:       Left ankle: He exhibits decreased range of motion.  Lymphadenopathy:    He has no cervical adenopathy.  Neurological: He is alert and oriented to person, place, and time.    BP 124/78   Pulse 72   Temp 98.2 F (36.8 C) (Oral)   Ht 5\' 6"  (1.676 m)   Wt 143 lb (64.9 kg)   SpO2 100%   BMI 23.08 kg/m   Assessment and Plan: 1. Acute non-recurrent maxillary sinusitis - amoxicillin-clavulanate (AUGMENTIN) 875-125 MG tablet; Take 1 tablet by mouth 2 (two) times daily for 10 days.  Dispense: 20 tablet; Refill: 0 - benzonatate (TESSALON) 100 MG capsule; Take 1 capsule (100 mg total) by mouth 3 (three) times daily.  Dispense: 20 capsule; Refill: 0  2. Status post ORIF of fracture of ankle Requests referral to another provider - Ambulatory referral to Orthopedic Surgery  3. Paranoid schizophrenia (HCC) To establish with [facility] asap  4. Tobacco use disorder Encouraged to quit  DMV forms filled out in the appropriate sections.  No evidence of HTN on exam.  CV section completed to indicated no restrictions.   Meds ordered this encounter    Medications  . amoxicillin-clavulanate (AUGMENTIN) 875-125 MG tablet    Sig: Take 1 tablet by mouth 2 (two) times daily for 10 days.    Dispense:  20 tablet    Refill:  0  . benzonatate (TESSALON) 100 MG capsule    Sig: Take 1 capsule (100 mg total) by mouth 3 (three) times daily.    Dispense:  20 capsule    Refill:  0    Partially dictated using Animal nutritionist. Any errors are unintentional.  Bari Edward, MD [facility]  05/05/2017

## 2017-05-08 DIAGNOSIS — M25572 Pain in left ankle and joints of left foot: Secondary | ICD-10-CM | POA: Diagnosis not present

## 2017-05-08 DIAGNOSIS — S82852S Displaced trimalleolar fracture of left lower leg, sequela: Secondary | ICD-10-CM | POA: Diagnosis not present

## 2017-05-08 DIAGNOSIS — M65872 Other synovitis and tenosynovitis, left ankle and foot: Secondary | ICD-10-CM | POA: Diagnosis not present

## 2017-05-16 DIAGNOSIS — F331 Major depressive disorder, recurrent, moderate: Secondary | ICD-10-CM | POA: Diagnosis not present

## 2017-05-16 DIAGNOSIS — F4310 Post-traumatic stress disorder, unspecified: Secondary | ICD-10-CM | POA: Diagnosis not present

## 2017-05-22 DIAGNOSIS — F209 Schizophrenia, unspecified: Secondary | ICD-10-CM | POA: Diagnosis not present

## 2017-05-28 DIAGNOSIS — F209 Schizophrenia, unspecified: Secondary | ICD-10-CM | POA: Diagnosis not present

## 2017-06-10 DIAGNOSIS — F209 Schizophrenia, unspecified: Secondary | ICD-10-CM | POA: Diagnosis not present

## 2017-07-29 DIAGNOSIS — F209 Schizophrenia, unspecified: Secondary | ICD-10-CM | POA: Diagnosis not present

## 2017-09-09 DIAGNOSIS — F209 Schizophrenia, unspecified: Secondary | ICD-10-CM | POA: Diagnosis not present

## 2017-12-07 IMAGING — DX DG ANKLE COMPLETE 3+V*L*
3 series · 3 of 3 positions shown · non-contrast
Comparison: 08/19/2015

CLINICAL DATA: Fell down stairs today twisting LEFT foot and ankle,
prior injury crying surgery 1.5 months ago, now having LEFT foot and
ankle pain

EXAM:
LEFT ANKLE COMPLETE - 3+ VIEW

[x ankle ap left]
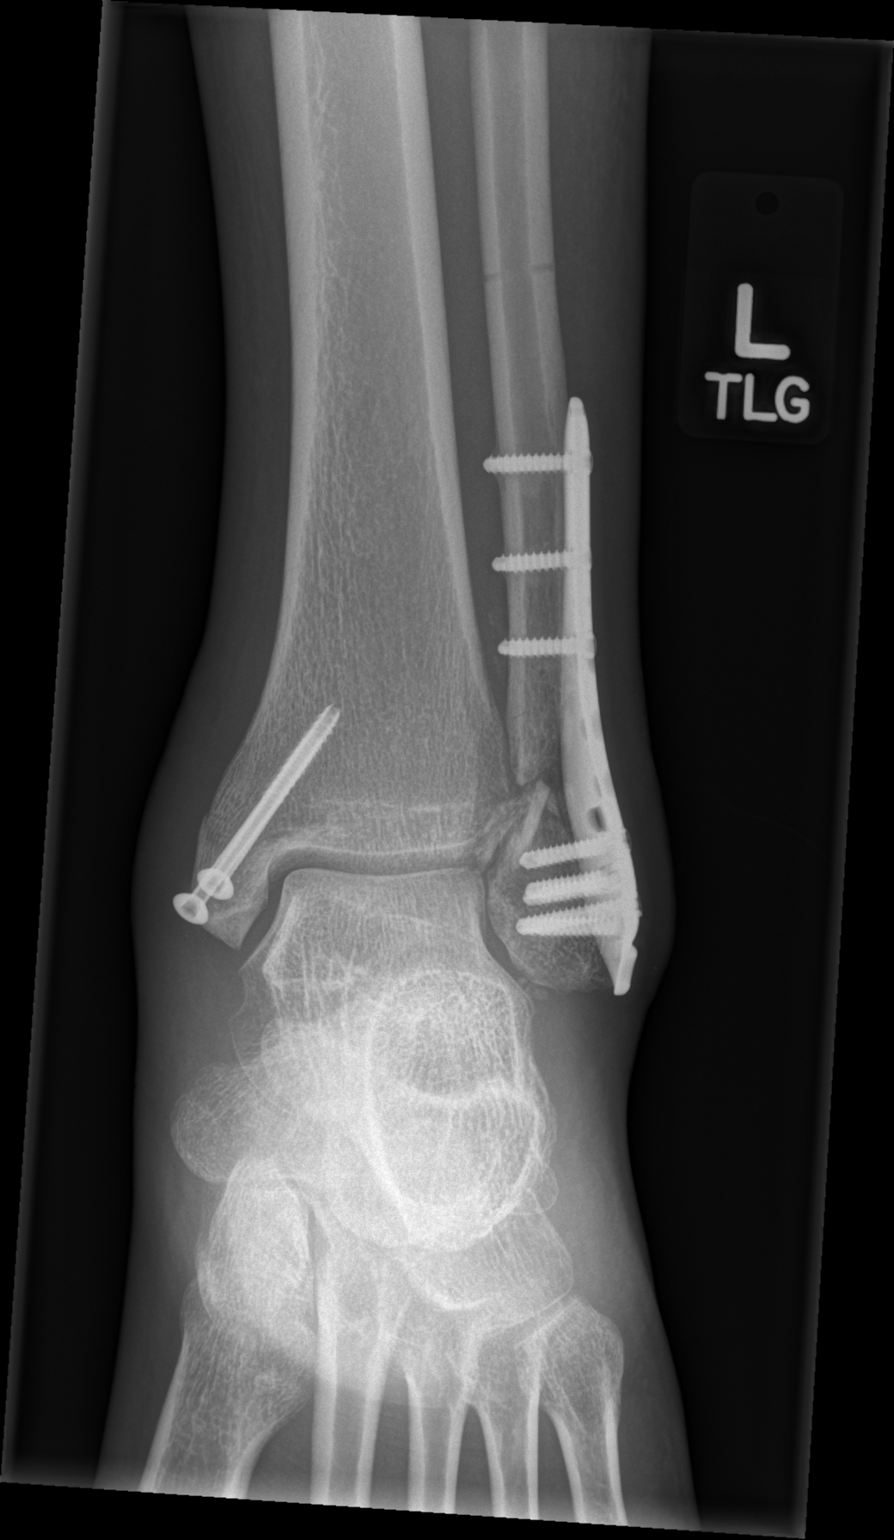

[x ankle obl left]
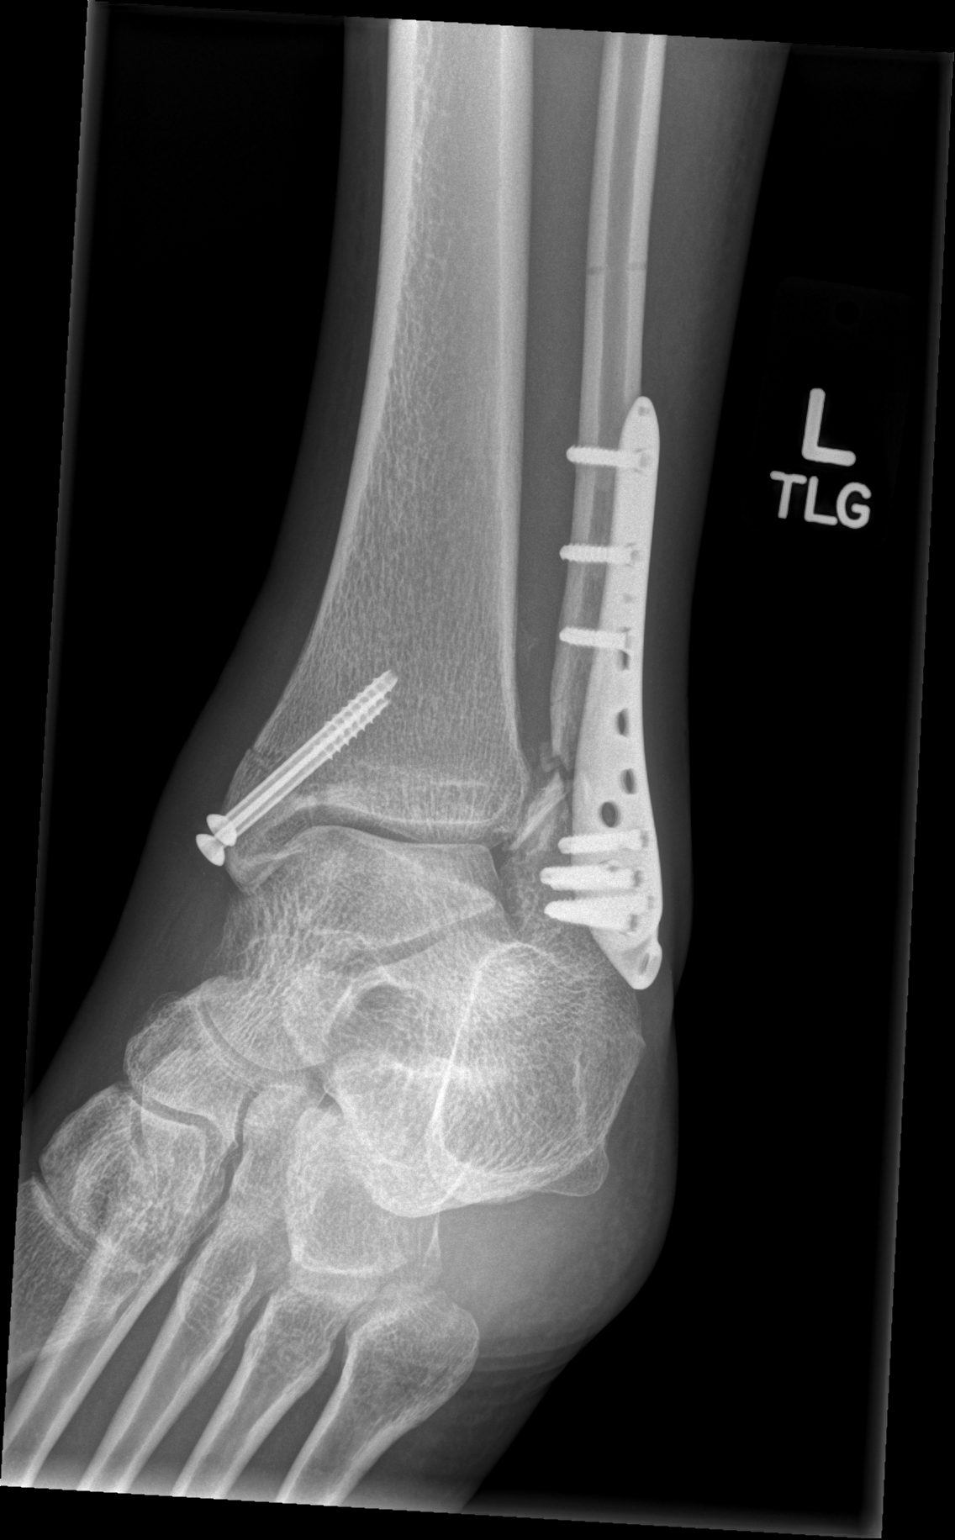

[x ankle lat left]
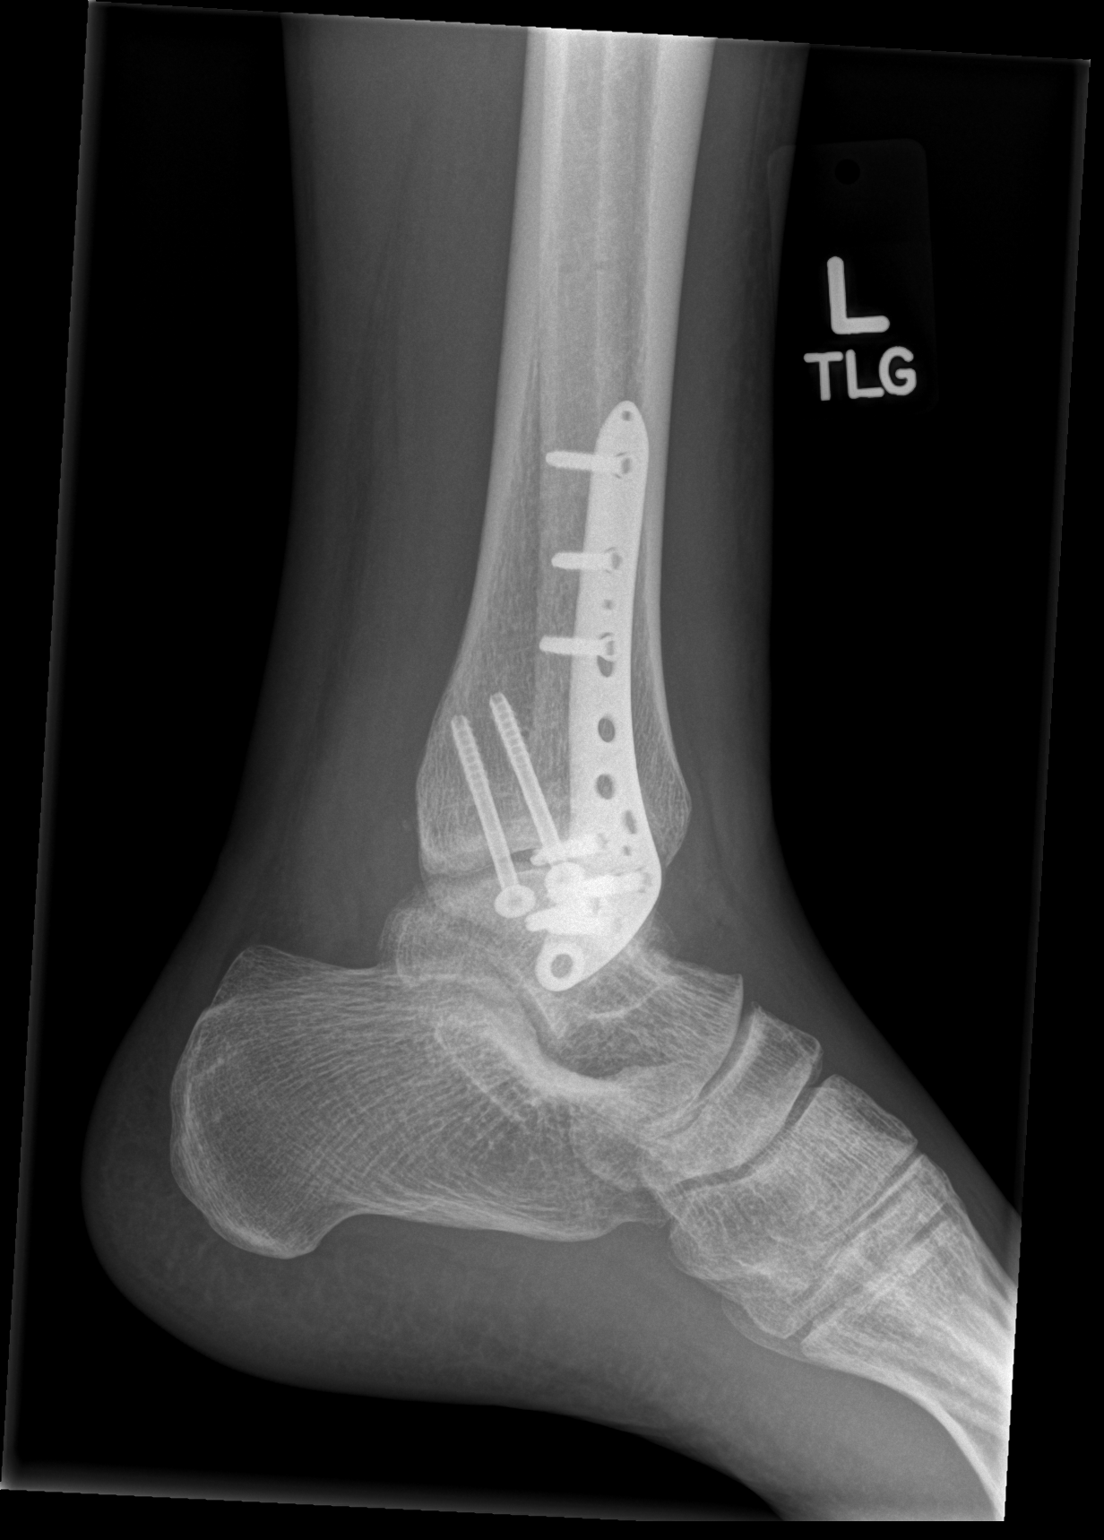

[3 of 3 positions shown; findings below may reference images not displayed]

FINDINGS: Two cannulated screws across a reduced fracture of the medial
malleolus.

Lateral plate and multiple screws across to the comminuted fracture
of the distal LEFT fibula.

Hardware appears intact.

Ankle mortise intact.

Several small bone fragments are seen at the inferior margin of the
lateral joint line.

No definite bridging callus identified.

No additional fracture dislocation identified.

Screw track noted at the fibular diaphysis.

No definite new bony abnormalities seen.

Regional soft tissue swelling LEFT ankle.
IMPRESSION: Post bimalleolar ORIF.

No definite new osseous abnormalities.

## 2018-11-02 ENCOUNTER — Other Ambulatory Visit: Payer: Self-pay

## 2018-11-02 DIAGNOSIS — Z20822 Contact with and (suspected) exposure to covid-19: Secondary | ICD-10-CM

## 2018-11-03 LAB — NOVEL CORONAVIRUS, NAA: SARS-CoV-2, NAA: NOT DETECTED

## 2018-11-05 ENCOUNTER — Telehealth: Payer: Self-pay

## 2018-11-05 NOTE — Telephone Encounter (Signed)
Received call from patient to check Covid results.  Advised patient he is negative.

## 2019-01-12 ENCOUNTER — Encounter: Payer: Self-pay | Admitting: Emergency Medicine

## 2019-01-12 ENCOUNTER — Other Ambulatory Visit: Payer: Self-pay

## 2019-01-12 ENCOUNTER — Emergency Department
Admission: EM | Admit: 2019-01-12 | Discharge: 2019-01-12 | Disposition: A | Discharge status: Home / Self Care | Payer: Self-pay | Attending: Emergency Medicine | Admitting: Emergency Medicine

## 2019-01-12 DIAGNOSIS — R109 Unspecified abdominal pain: Secondary | ICD-10-CM

## 2019-01-12 DIAGNOSIS — R1030 Lower abdominal pain, unspecified: Secondary | ICD-10-CM | POA: Insufficient documentation

## 2019-01-12 DIAGNOSIS — F17210 Nicotine dependence, cigarettes, uncomplicated: Secondary | ICD-10-CM | POA: Insufficient documentation

## 2019-01-12 DIAGNOSIS — R112 Nausea with vomiting, unspecified: Secondary | ICD-10-CM | POA: Insufficient documentation

## 2019-01-12 LAB — CBC WITH DIFFERENTIAL/PLATELET
Abs Immature Granulocytes: 0.02 10*3/uL (ref 0.00–0.07)
Basophils Absolute: 0.1 10*3/uL (ref 0.0–0.1)
Basophils Relative: 1 %
Eosinophils Absolute: 0.2 10*3/uL (ref 0.0–0.5)
Eosinophils Relative: 2 %
HCT: 45 % (ref 39.0–52.0)
Hemoglobin: 15 g/dL (ref 13.0–17.0)
Immature Granulocytes: 0 %
Lymphocytes Relative: 26 %
Lymphs Abs: 2.2 10*3/uL (ref 0.7–4.0)
MCH: 28.6 pg (ref 26.0–34.0)
MCHC: 33.3 g/dL (ref 30.0–36.0)
MCV: 85.9 fL (ref 80.0–100.0)
Monocytes Absolute: 1 10*3/uL (ref 0.1–1.0)
Monocytes Relative: 12 %
Neutro Abs: 5 10*3/uL (ref 1.7–7.7)
Neutrophils Relative %: 59 %
Platelets: 221 10*3/uL (ref 150–400)
RBC: 5.24 MIL/uL (ref 4.22–5.81)
RDW: 12.2 % (ref 11.5–15.5)
WBC: 8.5 10*3/uL (ref 4.0–10.5)
nRBC: 0 % (ref 0.0–0.2)

## 2019-01-12 LAB — COMPREHENSIVE METABOLIC PANEL
ALT: 17 U/L (ref 0–44)
AST: 20 U/L (ref 15–41)
Albumin: 4.3 g/dL (ref 3.5–5.0)
Alkaline Phosphatase: 94 U/L (ref 38–126)
Anion gap: 11 (ref 5–15)
BUN: 14 mg/dL (ref 6–20)
CO2: 33 mmol/L — ABNORMAL HIGH (ref 22–32)
Calcium: 9.5 mg/dL (ref 8.9–10.3)
Chloride: 94 mmol/L — ABNORMAL LOW (ref 98–111)
Creatinine, Ser: 1.15 mg/dL (ref 0.61–1.24)
GFR calc Af Amer: >60 mL/min (ref >60)
GFR calc non Af Amer: >60 mL/min (ref >60)
Glucose, Bld: 114 mg/dL — ABNORMAL HIGH (ref 70–99)
Potassium: 3.2 mmol/L — ABNORMAL LOW (ref 3.5–5.1)
Sodium: 138 mmol/L (ref 135–145)
Total Bilirubin: 0.8 mg/dL (ref 0.3–1.2)
Total Protein: 8 g/dL (ref 6.5–8.1)

## 2019-01-12 LAB — URINALYSIS, COMPLETE (UACMP) WITH MICROSCOPIC
Bacteria, UA: NONE SEEN
Glucose, UA: NEGATIVE mg/dL
Hgb urine dipstick: NEGATIVE
Ketones, ur: 5 mg/dL — AB
Leukocytes,Ua: NEGATIVE
Nitrite: NEGATIVE
Protein, ur: 30 mg/dL — AB
Specific Gravity, Urine: 1.033 — ABNORMAL HIGH (ref 1.005–1.030)
Squamous Epithelial / LPF: NONE SEEN (ref 0–5)
pH: 5 (ref 5.0–8.0)

## 2019-01-12 LAB — URINE DRUG SCREEN, QUALITATIVE (ARMC ONLY)
Amphetamines, Ur Screen: POSITIVE — AB
Barbiturates, Ur Screen: NOT DETECTED
Benzodiazepine, Ur Scrn: NOT DETECTED
Cannabinoid 50 Ng, Ur ~~LOC~~: NOT DETECTED
Cocaine Metabolite,Ur ~~LOC~~: NOT DETECTED
MDMA (Ecstasy)Ur Screen: NOT DETECTED
Methadone Scn, Ur: NOT DETECTED
Opiate, Ur Screen: POSITIVE — AB
Phencyclidine (PCP) Ur S: NOT DETECTED
Tricyclic, Ur Screen: NOT DETECTED

## 2019-01-12 LAB — LIPASE, BLOOD: Lipase: 25 U/L (ref 11–51)

## 2019-01-12 MED ORDER — PROMETHAZINE HCL 25 MG PO TABS: 25.0000 mg | ORAL_TABLET | ORAL | 1 refills | Status: DC | PRN

## 2019-01-12 MED ORDER — PROMETHAZINE HCL 25 MG PO TABS
25.0000 mg | ORAL_TABLET | Freq: Once | ORAL | Status: AC
  Administered 2019-01-12: 07:00:00 25 mg via ORAL
  Filled 2019-01-12: qty 1

## 2019-01-12 MED ORDER — DICYCLOMINE HCL 20 MG PO TABS: 20.0000 mg | ORAL_TABLET | Freq: Three times a day (TID) | ORAL | 1 refills | Status: DC | PRN

## 2019-01-12 MED ORDER — DICYCLOMINE HCL 20 MG PO TABS
20.0000 mg | ORAL_TABLET | Freq: Once | ORAL | Status: AC
  Administered 2019-01-12: 07:00:00 20 mg via ORAL
  Filled 2019-01-12: qty 1

## 2019-01-12 NOTE — ED Notes (Signed)
MD informed this RN that pt was no longer in room when he went to check on pt.

## 2019-01-12 NOTE — ED Notes (Addendum)
Pt was set to be d/c after MD re-evaluated pt. Pt left prior to receiving d/c paperwork. Attempted to contact pt at [phone] but was unsuccessful, no answer and unable to leave message.

## 2019-01-12 NOTE — ED Triage Notes (Signed)
Patient ambulatory to triage with steady gait, without difficulty or distress noted, mask in place; pt reports lower abd pain for "weeks" accomp by N/V; st "I just got tired of suffering"

## 2019-01-12 NOTE — ED Provider Notes (Signed)
[facility] Emergency Department Provider Note       Time seen: ----------------------------------------- 7:04 AM on 01/12/2019 -----------------------------------------   I have reviewed the triage vital signs and the nursing notes.  HISTORY   Chief Complaint Abdominal Pain    HPI Lucas Guerra is a 33 y.o. male with a history of anxiety, GERD, schizophrenia who presents to the ED for abdominal pain that he has been dealing with for years.  Patient states is accompanied by nausea and vomiting.  Patient has pain in his lower abdomen that he is concerned is related to his gallbladder.  He reports having endoscopy in the past without specific etiology discovered.  Past Medical History:  Diagnosis Date  . Anxiety   . GERD (gastroesophageal reflux disease)   . Heart murmur   . Plantar wart of right foot   . Schizophrenia [facility])     Patient Active Problem List   Diagnosis Date Noted  . Tobacco use disorder 05/05/2017  . Status post ORIF of fracture of ankle 06/10/2016  . 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 Paranoid schizophrenia (HCC) 11/02/2014  . Acid reflux 11/02/2014  . Dysphagia 11/02/2014  . Disorder of mitral valve 11/02/2014    Past Surgical History:  Procedure Laterality Date  . ESOPHAGOGASTRODUODENOSCOPY    . HARDWARE REMOVAL Left 06/04/2016   Procedure: HARDWARE REMOVAL WITH POSSIBLE BONE GRAFTING OF THE DISTAL FIBULAR FRACTURE;  Surgeon: Christena Flake, MD;  Location: [facility] ORS;  Service: Orthopedics;  Laterality: Left;  . ORIF ANKLE FRACTURE Right 08/19/15  . ORIF ANKLE FRACTURE Left 08/19/2015   Procedure: OPEN REDUCTION INTERNAL FIXATION (ORIF) ANKLE FRACTURE;  Surgeon: Christena Flake, MD;  Location: [facility] ORS;  Service: Orthopedics;  Laterality: Left;  . PLANTAR'S WART EXCISION Right     Allergies Tramadol  Social History Social History   Tobacco Use  . Smoking status: Current Every Day Smoker    Packs/day: 2.00    Years:  12.00    Pack years: 24.00    Types: Cigarettes  . Smokeless tobacco: Never Used  Substance Use Topics  . Alcohol use: No    Alcohol/week: 0.0 standard drinks    Comment: rare consumption  . Drug use: No    Review of Systems Constitutional: Negative for fever. Cardiovascular: Negative for chest pain. Respiratory: Negative for shortness of breath. Gastrointestinal: Positive for abdominal pain, vomiting Musculoskeletal: Negative for back pain. Skin: Negative for rash. Neurological: Negative for headaches, focal weakness or numbness.  All systems negative/normal/unremarkable except as stated in the HPI  ____________________________________________   PHYSICAL EXAM:  VITAL SIGNS: ED Triage Vitals [01/12/19 0437]  Enc Vitals Group     BP 133/77     Pulse Rate 92     Resp 20     Temp 98.8 F (37.1 C)     Temp Source Oral     SpO2 97 %     Weight 130 lb (59 kg)     Height 5\' 6"  (1.676 m)     Head Circumference      Peak Flow      Pain Score 10     Pain Loc      Pain Edu?      Excl. in GC?     Constitutional: Alert and oriented. Well appearing and in no distress. Eyes: Conjunctivae are normal. Normal extraocular movements. Cardiovascular: Normal rate, regular rhythm. No murmurs, rubs, or gallops. Respiratory: Normal respiratory effort without tachypnea nor retractions. Breath sounds are clear and  equal bilaterally. No wheezes/rales/rhonchi. Gastrointestinal: Suprapubic tenderness, no rebound or guarding.  Normal bowel sounds. Musculoskeletal: Nontender with normal range of motion in extremities. No lower extremity tenderness nor edema. Neurologic:  Normal speech and language. No gross focal neurologic deficits are appreciated.  Skin:  Skin is warm, dry and intact. No rash noted. Psychiatric: Mood and affect are normal. Speech and behavior are normal.  ___________________________________________  ED COURSE:  As part of my medical decision making, I reviewed the  following data within the [place] History obtained from family if available, nursing notes, old chart and ekg, as well as notes from prior ED visits. Patient presented for abdominal pain, we will assess with labs and imaging as indicated at this time.   Procedures  [name] was evaluated in Emergency Department on 01/12/2019 for the symptoms described in the history of present illness. He was evaluated in the context of the global COVID-19 pandemic, which necessitated consideration that the patient might be at risk for infection with the SARS-CoV-2 virus that causes COVID-19. Institutional protocols and algorithms that pertain to the evaluation of patients at risk for COVID-19 are in a state of rapid change based on information released by regulatory bodies including the CDC and federal and state organizations. These policies and algorithms were followed during the patient's care in the ED.  ____________________________________________   LABS (pertinent positives/negatives)  Labs Reviewed  COMPREHENSIVE METABOLIC PANEL - Abnormal; Notable for the following components:      Result Value   Potassium 3.2 (*)    Chloride 94 (*)    CO2 33 (*)    Glucose, Bld 114 (*)    All other components within normal limits  URINALYSIS, COMPLETE (UACMP) WITH MICROSCOPIC - Abnormal; Notable for the following components:   Color, Urine AMBER (*)    APPearance HAZY (*)    Specific Gravity, Urine 1.033 (*)    Bilirubin Urine SMALL (*)    Ketones, ur 5 (*)    Protein, ur 30 (*)    All other components within normal limits  CBC WITH DIFFERENTIAL/PLATELET  LIPASE, BLOOD  URINE DRUG SCREEN, QUALITATIVE ([facility] ONLY)   ____________________________________________   DIFFERENTIAL DIAGNOSIS   GERD, peptic ulcer disease, IBS, UTI, constipation, gas pain  FINAL ASSESSMENT AND PLAN  Abdominal pain, vomiting   Plan: The patient had presented for nonspecific abdominal pain. Patient's  labs are unremarkable.  Patient was given Phenergan and Bentyl here.  These appear to be chronic symptoms that he has with a benign examination currently.  He is cleared for outpatient GI follow-up.   Laurence Aly, MD    Note: This note was generated in part or whole with voice recognition software. Voice recognition is usually quite accurate but there are transcription errors that can and very often do occur. I apologize for any typographical errors that were not detected and corrected.     Earleen Newport, MD 01/12/19 [phone]

## 2019-04-01 ENCOUNTER — Other Ambulatory Visit: Payer: Self-pay

## 2019-04-01 ENCOUNTER — Encounter: Payer: Self-pay | Admitting: Psychiatry

## 2019-04-01 ENCOUNTER — Emergency Department
Admission: EM | Admit: 2019-04-01 | Discharge: 2019-04-01 | Disposition: A | Discharge status: Other Acute Inpatient Hospital | Payer: Self-pay | Attending: Emergency Medicine | Admitting: Emergency Medicine

## 2019-04-01 ENCOUNTER — Inpatient Hospital Stay
Admission: RE | Admit: 2019-04-01 | Discharge: 2019-04-04 | DRG: 885 | Disposition: A | Discharge status: Home / Self Care | Payer: Medicare Other | Source: Intra-hospital | Attending: Psychiatry | Admitting: Psychiatry

## 2019-04-01 ENCOUNTER — Encounter: Payer: Self-pay | Admitting: Emergency Medicine

## 2019-04-01 DIAGNOSIS — F410 Panic disorder [episodic paroxysmal anxiety] without agoraphobia: Secondary | ICD-10-CM | POA: Diagnosis present

## 2019-04-01 DIAGNOSIS — Z79899 Other long term (current) drug therapy: Secondary | ICD-10-CM | POA: Diagnosis not present

## 2019-04-01 DIAGNOSIS — R011 Cardiac murmur, unspecified: Secondary | ICD-10-CM | POA: Diagnosis not present

## 2019-04-01 DIAGNOSIS — Z888 Allergy status to other drugs, medicaments and biological substances status: Secondary | ICD-10-CM

## 2019-04-01 DIAGNOSIS — R45851 Suicidal ideations: Secondary | ICD-10-CM | POA: Insufficient documentation

## 2019-04-01 DIAGNOSIS — F419 Anxiety disorder, unspecified: Secondary | ICD-10-CM | POA: Diagnosis present

## 2019-04-01 DIAGNOSIS — F200 Paranoid schizophrenia: Secondary | ICD-10-CM | POA: Insufficient documentation

## 2019-04-01 DIAGNOSIS — K219 Gastro-esophageal reflux disease without esophagitis: Secondary | ICD-10-CM | POA: Diagnosis present

## 2019-04-01 DIAGNOSIS — F29 Unspecified psychosis not due to a substance or known physiological condition: Secondary | ICD-10-CM | POA: Diagnosis present

## 2019-04-01 DIAGNOSIS — Z008 Encounter for other general examination: Secondary | ICD-10-CM | POA: Insufficient documentation

## 2019-04-01 DIAGNOSIS — F1910 Other psychoactive substance abuse, uncomplicated: Secondary | ICD-10-CM | POA: Diagnosis present

## 2019-04-01 DIAGNOSIS — F17210 Nicotine dependence, cigarettes, uncomplicated: Secondary | ICD-10-CM | POA: Diagnosis present

## 2019-04-01 DIAGNOSIS — F209 Schizophrenia, unspecified: Secondary | ICD-10-CM | POA: Diagnosis present

## 2019-04-01 DIAGNOSIS — Z20822 Contact with and (suspected) exposure to covid-19: Secondary | ICD-10-CM | POA: Insufficient documentation

## 2019-04-01 DIAGNOSIS — R5383 Other fatigue: Secondary | ICD-10-CM | POA: Insufficient documentation

## 2019-04-01 DIAGNOSIS — Z590 Homelessness: Secondary | ICD-10-CM | POA: Diagnosis not present

## 2019-04-01 DIAGNOSIS — Z9114 Patient's other noncompliance with medication regimen: Secondary | ICD-10-CM | POA: Diagnosis not present

## 2019-04-01 LAB — CBC WITH DIFFERENTIAL/PLATELET
Abs Immature Granulocytes: 0.01 10*3/uL (ref 0.00–0.07)
Basophils Absolute: 0 10*3/uL (ref 0.0–0.1)
Basophils Relative: 1 %
Eosinophils Absolute: 0.2 10*3/uL (ref 0.0–0.5)
Eosinophils Relative: 3 %
HCT: 40.3 % (ref 39.0–52.0)
Hemoglobin: 13.5 g/dL (ref 13.0–17.0)
Immature Granulocytes: 0 %
Lymphocytes Relative: 53 %
Lymphs Abs: 3.5 10*3/uL (ref 0.7–4.0)
MCH: 28.8 pg (ref 26.0–34.0)
MCHC: 33.5 g/dL (ref 30.0–36.0)
MCV: 85.9 fL (ref 80.0–100.0)
Monocytes Absolute: 0.4 10*3/uL (ref 0.1–1.0)
Monocytes Relative: 6 %
Neutro Abs: 2.5 10*3/uL (ref 1.7–7.7)
Neutrophils Relative %: 37 %
Platelets: 217 10*3/uL (ref 150–400)
RBC: 4.69 MIL/uL (ref 4.22–5.81)
RDW: 12.2 % (ref 11.5–15.5)
Smear Review: NORMAL
WBC: 6.4 10*3/uL (ref 4.0–10.5)
nRBC: 0 % (ref 0.0–0.2)

## 2019-04-01 LAB — URINE DRUG SCREEN, QUALITATIVE (ARMC ONLY)
Amphetamines, Ur Screen: POSITIVE — AB
Barbiturates, Ur Screen: NOT DETECTED
Benzodiazepine, Ur Scrn: NOT DETECTED
Cannabinoid 50 Ng, Ur ~~LOC~~: NOT DETECTED
Cocaine Metabolite,Ur ~~LOC~~: NOT DETECTED
MDMA (Ecstasy)Ur Screen: NOT DETECTED
Methadone Scn, Ur: NOT DETECTED
Opiate, Ur Screen: POSITIVE — AB
Phencyclidine (PCP) Ur S: NOT DETECTED
Tricyclic, Ur Screen: NOT DETECTED

## 2019-04-01 LAB — TROPONIN I (HIGH SENSITIVITY): Troponin I (High Sensitivity): <2 ng/L (ref <18)

## 2019-04-01 LAB — COMPREHENSIVE METABOLIC PANEL
ALT: 13 U/L (ref 0–44)
AST: 16 U/L (ref 15–41)
Albumin: 4 g/dL (ref 3.5–5.0)
Alkaline Phosphatase: 69 U/L (ref 38–126)
Anion gap: 9 (ref 5–15)
BUN: 19 mg/dL (ref 6–20)
CO2: 33 mmol/L — ABNORMAL HIGH (ref 22–32)
Calcium: 9.4 mg/dL (ref 8.9–10.3)
Chloride: 95 mmol/L — ABNORMAL LOW (ref 98–111)
Creatinine, Ser: 1.09 mg/dL (ref 0.61–1.24)
GFR calc Af Amer: >60 mL/min (ref >60)
GFR calc non Af Amer: >60 mL/min (ref >60)
Glucose, Bld: 82 mg/dL (ref 70–99)
Potassium: 3.1 mmol/L — ABNORMAL LOW (ref 3.5–5.1)
Sodium: 137 mmol/L (ref 135–145)
Total Bilirubin: 0.6 mg/dL (ref 0.3–1.2)
Total Protein: 7.1 g/dL (ref 6.5–8.1)

## 2019-04-01 LAB — RESPIRATORY PANEL BY RT PCR (FLU A&B, COVID)
Influenza A by PCR: NEGATIVE
Influenza B by PCR: NEGATIVE
SARS Coronavirus 2 by RT PCR: NEGATIVE

## 2019-04-01 LAB — ETHANOL: Alcohol, Ethyl (B): <10 mg/dL (ref <10)

## 2019-04-01 MED ORDER — ACETAMINOPHEN 325 MG PO TABS: 650.0000 mg | ORAL_TABLET | Freq: Four times a day (QID) | ORAL | Status: DC | PRN

## 2019-04-01 MED ORDER — LIDOCAINE VISCOUS HCL 2 % MT SOLN
15.0000 mL | Freq: Once | OROMUCOSAL | Status: AC
  Administered 2019-04-01: 10:00:00 15 mL via ORAL
  Filled 2019-04-01: qty 15

## 2019-04-01 MED ORDER — HALOPERIDOL LACTATE 5 MG/ML IJ SOLN
5.0000 mg | Freq: Once | INTRAMUSCULAR | Status: AC
  Administered 2019-04-01: 09:00:00 5 mg via INTRAMUSCULAR
  Filled 2019-04-01: qty 1

## 2019-04-01 MED ORDER — ALUM & MAG HYDROXIDE-SIMETH 200-200-20 MG/5ML PO SUSP
30.0000 mL | Freq: Once | ORAL | Status: AC
  Administered 2019-04-01: 10:00:00 30 mL via ORAL
  Filled 2019-04-01: qty 30

## 2019-04-01 MED ORDER — DIPHENHYDRAMINE HCL 50 MG/ML IJ SOLN
25.0000 mg | Freq: Once | INTRAMUSCULAR | Status: AC
  Administered 2019-04-01: 09:00:00 25 mg via INTRAMUSCULAR
  Filled 2019-04-01: qty 1

## 2019-04-01 MED ORDER — MAGNESIUM HYDROXIDE 400 MG/5ML PO SUSP: 30.0000 mL | Freq: Every day | ORAL | Status: DC | PRN

## 2019-04-01 MED ORDER — ALUM & MAG HYDROXIDE-SIMETH 200-200-20 MG/5ML PO SUSP
30.0000 mL | ORAL | Status: DC | PRN
  Administered 2019-04-03: 14:00:00 30 mL via ORAL
  Filled 2019-04-01: qty 30

## 2019-04-01 MED ORDER — DIPHENHYDRAMINE HCL 50 MG/ML IJ SOLN
25.0000 mg | Freq: Once | INTRAMUSCULAR | Status: AC
  Administered 2019-04-01: 10:00:00 25 mg via INTRAVENOUS
  Filled 2019-04-01: qty 1

## 2019-04-01 MED ORDER — OLANZAPINE 5 MG PO TABS
5.0000 mg | ORAL_TABLET | Freq: Two times a day (BID) | ORAL | Status: DC
  Administered 2019-04-01 – 2019-04-04 (×6): 5 mg via ORAL
  Filled 2019-04-01 (×6): qty 1

## 2019-04-01 MED ORDER — LORAZEPAM 1 MG PO TABS: 1.0000 mg | ORAL_TABLET | Freq: Once | ORAL | Status: DC

## 2019-04-01 MED ORDER — LORAZEPAM 2 MG/ML IJ SOLN
1.0000 mg | Freq: Once | INTRAMUSCULAR | Status: AC
  Administered 2019-04-01: 10:00:00 1 mg via INTRAVENOUS
  Filled 2019-04-01: qty 1

## 2019-04-01 MED ORDER — POTASSIUM CHLORIDE CRYS ER 20 MEQ PO TBCR
40.0000 meq | EXTENDED_RELEASE_TABLET | Freq: Once | ORAL | Status: AC
  Administered 2019-04-01: 08:00:00 40 meq via ORAL
  Filled 2019-04-01: qty 2

## 2019-04-01 NOTE — Plan of Care (Signed)
New admit  Problem: Education: Goal: Knowledge of [place] [facility] Education information/materials will improve Outcome: Not Progressing Goal: Emotional status will improve Outcome: Not Progressing Goal: Mental status will improve Outcome: Not Progressing Goal: Verbalization of understanding the information provided will improve Outcome: Not Progressing   Problem: Safety: Goal: Periods of time without injury will increase Outcome: Not Progressing   Problem: Education: Goal: Will be free of psychotic symptoms Outcome: Not Progressing Goal: Knowledge of the prescribed therapeutic regimen will improve Outcome: Not Progressing   Problem: Safety: Goal: Ability to redirect hostility and anger into socially appropriate behaviors will improve Outcome: Not Progressing Goal: Ability to remain free from injury will improve Outcome: Not Progressing

## 2019-04-01 NOTE — Progress Notes (Signed)
Patient admitted from [facility] ED at 2000. Patient pleasant during assessment, skin check completed with [facility] RN. No abnormalities found, patient does have tattoos bilateral arms and top of back. No contraband found. Patient denies SI/HI/AVH, pain, anxiety and depression. Patient said he is here voluntary because of substance abuse (Heroine). Patient oriented to his room and the unit. Patient given education. Patient given support and encouragement to be active in his treatment plan. Patient being monitored Q 15 minutes for safety per unit protocol. Patient remains safe on the unit.

## 2019-04-01 NOTE — ED Notes (Signed)
Pt asking for his things back and to leave. RN attempted to redirect, but pt persistent with wishes to leave. MD notified, and offered additional medications, which pt initially refused. Pt states his abd and chest are hurting and he cant sit down due to pressure. RN offered pt stretcher bed but he again refused and asked to leave. MD spoke to pt and pt agreed to stay. Pt placed onto stretcher, IV started, and cardiac monitoring applied to the pt.

## 2019-04-01 NOTE — BH Assessment (Signed)
Patient is to be admitted to [facility] BMU by Dr. Harvin Hazel.  Attending Physician will be Dr. Jola Babinski.   Patient has been assigned to room 315, by [facility] Charge Nurse Gwen.   Intake Paper Work has been signed and placed on patient chart.  ER staff is aware of the admission:  Nitchia, ER Secretary    Dr. Erma Heritage, ER MD   Hilbert Corrigan, Patient's Nurse   Ethelene Browns, Patient Access.

## 2019-04-01 NOTE — ED Notes (Signed)
Pt given saltine crackers and lemon lime soda. Shortly after taking medication pt states he feels nauseous and asked for emesis bag.

## 2019-04-01 NOTE — Tx Team (Signed)
Initial Treatment Plan 04/01/2019 8:28 PM [name] LTR:[id number]    PATIENT STRESSORS: Medication change or noncompliance Substance abuse   PATIENT STRENGTHS: Ability for insight Motivation for treatment/growth   PATIENT IDENTIFIED PROBLEMS: Substance Abuse  Psychosis                   DISCHARGE CRITERIA:  Motivation to continue treatment in a less acute level of care Verbal commitment to aftercare and medication compliance  PRELIMINARY DISCHARGE PLAN: Outpatient therapy Return to previous living arrangement  PATIENT/FAMILY INVOLVEMENT: This treatment plan has been presented to and reviewed with the patient, Lucas Guerra. The patient has been given the opportunity to ask questions and make suggestions.  Elmyra Ricks, RN 04/01/2019, 8:28 PM

## 2019-04-01 NOTE — BH Assessment (Signed)
Assessment Note  Lucas Guerra is an 34 y.o. male who presents to the ER due to increase symptoms of schizophrenia. He states he has been off medications for an extended time. He started hearing voices before the age of [age]. When he was on his medications he was doing well. He was unable to give the specifics of when he last had his medications. He also was unable to remember the who his prescribing physician was. Patient was released from prison 10/29/2018 and it's unclear if he had them since then.  During the interview the patient was cooperative and pleasant. At times it appeared patient was responding to internal stimuli and was difficult to remain focus. At one point, patient was crying and saying he needed help and "I don't want to be like this no more." When asked what he was talking about, he touched his ear and said, "these voices." Overall, he was able to provide appropriate answers to questions. He denies history of aggression and violence. He was incarcerated for breaking and entering, larceny and obtaining property by false pretense.  Diagnosis: Schizophrenia  Past Medical History:  Past Medical History:  Diagnosis Date  . Anxiety   . GERD (gastroesophageal reflux disease)   . Heart murmur   . Plantar wart of right foot   . Schizophrenia [facility])     Past Surgical History:  Procedure Laterality Date  . ESOPHAGOGASTRODUODENOSCOPY    . HARDWARE REMOVAL Left 06/04/2016   Procedure: HARDWARE REMOVAL WITH POSSIBLE BONE GRAFTING OF THE DISTAL FIBULAR FRACTURE;  Surgeon: Christena Flake, MD;  Location: [facility] ORS;  Service: Orthopedics;  Laterality: Left;  . ORIF ANKLE FRACTURE Right 08/19/15  . ORIF ANKLE FRACTURE Left 08/19/2015   Procedure: OPEN REDUCTION INTERNAL FIXATION (ORIF) ANKLE FRACTURE;  Surgeon: Christena Flake, MD;  Location: [facility] ORS;  Service: Orthopedics;  Laterality: Left;  . PLANTAR'S WART EXCISION Right     Family History:  Family History  Problem Relation Age of  Onset  . Hypertension Mother   . Diabetes Father     Social History:  reports that he has been smoking cigarettes. He has a 24.00 pack-year smoking history. He has never used smokeless tobacco. He reports that he does not drink alcohol or use drugs.  Additional Social History:  Alcohol / Drug Use Pain Medications: See PTA Prescriptions: See PTA Over the Counter: See PTA History of alcohol / drug use?: Yes Longest period of sobriety (when/how long): Unable to quantify Substance #1 Name of Substance 1: Heroin 1 - Last Use / Amount: Unknown  CIWA: CIWA-Ar BP: (!) 159/72 Pulse Rate: 75 COWS:    Allergies:  Allergies  Allergen Reactions  . Tramadol Rash    Home Medications: (Not in a hospital admission)   OB/GYN Status:  No LMP for male patient.  General Assessment Data Location of Assessment: [facility] ED TTS Assessment: In system Is this a Tele or Face-to-Face Assessment?: Face-to-Face Is this an Initial Assessment or a Re-assessment for this encounter?: Initial Assessment Patient Accompanied by:: N/A Language Other than English: No Living Arrangements: Homeless/Shelter What gender do you identify as?: Male Marital status: Single Pregnancy Status: No Living Arrangements: Other (Comment)(Homeless) Can pt return to current living arrangement?: Yes Admission Status: Voluntary Is patient capable of signing voluntary admission?: Yes Referral Source: Self/Family/Friend Insurance type: None  Medical Screening Exam [facility] Walk-in ONLY) Medical Exam completed: Yes  Crisis Care Plan Living Arrangements: Other (Comment)(Homeless) Legal Guardian: Other:(Self) Name of Psychiatrist: Reports of none Name  of Therapist: Reports of none  Education Status Is patient currently in school?: No Is the patient employed, unemployed or receiving disability?: Employed  Risk to self with the past 6 months Suicidal Ideation: No Has patient been a risk to self within the past 6 months prior  to admission? : No Suicidal Intent: No Has patient had any suicidal intent within the past 6 months prior to admission? : No Is patient at risk for suicide?: No Suicidal Plan?: No Has patient had any suicidal plan within the past 6 months prior to admission? : No Access to Means: No What has been your use of drugs/alcohol within the last 12 months?: Heroin Previous Attempts/Gestures: No How many times?: 0 Other Self Harm Risks: Reports of none Triggers for Past Attempts: None known Intentional Self Injurious Behavior: None Family Suicide History: No Recent stressful life event(s): Other (Comment) Persecutory voices/beliefs?: Yes Depression: Yes Depression Symptoms: Fatigue, Isolating, Feeling worthless/self pity, Guilt, Loss of interest in usual pleasures Substance abuse history and/or treatment for substance abuse?: No Suicide prevention information given to non-admitted patients: Not applicable  Risk to Others within the past 6 months Homicidal Ideation: No Does patient have any lifetime risk of violence toward others beyond the six months prior to admission? : No Thoughts of Harm to Others: No Current Homicidal Intent: No Current Homicidal Plan: No Access to Homicidal Means: No Identified Victim: Reports of none History of harm to others?: No Assessment of Violence: None Noted Violent Behavior Description: Reports of none Does patient have access to weapons?: No Criminal Charges Pending?: No Does patient have a court date: No Is patient on probation?: No  Psychosis Hallucinations: Auditory Delusions: None noted  Mental Status Report Appearance/Hygiene: Unremarkable, In scrubs Eye Contact: Good Motor Activity: Freedom of movement, Unremarkable Speech: Logical/coherent, Unremarkable Level of Consciousness: Alert Mood: Anxious, Depressed, Sad, Pleasant, Helpless Affect: Anxious, Appropriate to circumstance, Depressed, Sad Anxiety Level: Moderate Thought Processes:  Coherent, Relevant Judgement: Partial Orientation: Person, Place, Time, Situation, Appropriate for developmental age Obsessive Compulsive Thoughts/Behaviors: Minimal  Cognitive Functioning Concentration: Decreased Memory: Remote Impaired, Recent Intact Is patient IDD: No Insight: Fair Impulse Control: Fair Appetite: Good Have you had any weight changes? : No Change Sleep: Decreased Total Hours of Sleep: 6 Vegetative Symptoms: None  ADLScreening [facility] Assessment Services) Patient's cognitive ability adequate to safely complete daily activities?: Yes Patient able to express need for assistance with ADLs?: Yes Independently performs ADLs?: Yes (appropriate for developmental age)  Prior Inpatient Therapy Prior Inpatient Therapy: No  Prior Outpatient Therapy Prior Outpatient Therapy: Yes Prior Therapy Dates: He can't remember the dates Prior Therapy Facilty/Provider(s): He can't remember the name Reason for Treatment: Schizophrenia Does patient have an ACCT team?: No Does patient have Intensive In-House Services?  : No Does patient have [facility] services? : No Does patient have P4CC services?: No  ADL Screening (condition at time of admission) Patient's cognitive ability adequate to safely complete daily activities?: Yes Is the patient deaf or have difficulty hearing?: No Does the patient have difficulty seeing, even when wearing glasses/contacts?: No Does the patient have difficulty concentrating, remembering, or making decisions?: No Patient able to express need for assistance with ADLs?: Yes Does the patient have difficulty dressing or bathing?: No Independently performs ADLs?: Yes (appropriate for developmental age) Does the patient have difficulty walking or climbing stairs?: No Weakness of Legs: None Weakness of Arms/Hands: None  Home Assistive Devices/Equipment Home Assistive Devices/Equipment: None  Therapy Consults (therapy consults require a physician order) PT  Evaluation Needed: No OT Evalulation Needed: No SLP Evaluation Needed: No Abuse/Neglect Assessment (Assessment to be complete while patient is alone) Abuse/Neglect Assessment Can Be Completed: Yes Physical Abuse: Denies Verbal Abuse: Denies Sexual Abuse: Denies Exploitation of patient/patient's resources: Denies Self-Neglect: Denies Values / Beliefs Cultural Requests During Hospitalization: None Spiritual Requests During Hospitalization: None Consults Spiritual Care Consult Needed: No Transition of Care Team Consult Needed: No    Child/Adolescent Assessment Running Away Risk: Denies(Patient is an adult)  Disposition:  Disposition Initial Assessment Completed for this Encounter: Yes  On Site Evaluation by:   Reviewed with Physician:    Gunnar Fusi MS, LCAS, [facility], [place] Therapeutic Triage Specialist 04/01/2019 12:31 PM

## 2019-04-01 NOTE — ED Provider Notes (Signed)
[facility] Emergency Department Provider Note  ____________________________________________   First MD Initiated Contact with Patient 04/01/19 0730     (approximate)  I have reviewed the triage vital signs and the nursing notes.   HISTORY  Chief Complaint Psychiatric Evaluation    HPI Lucas Guerra is a 34 y.o. male  Here with auditory hallucinations, panic attacks. Pt reports that since leaving jail 7 mo ago, he has not been on any of his psych meds. He has had progressively worsening hallucinations since then with voices telling him to hurt himself and others. He has been attempting to self medicate by distracting himself, trying to "stay busy" without success. He denies any SI or thoughts to harm himself but does state he has been more angry, agitated, and doesn't know what he would do if provoked in terms of HI. Admit to occasional drug use. He has some chronic n/v which is not acutely worsened. No other complaints.       Past Medical History:  Diagnosis Date  . Anxiety   . GERD (gastroesophageal reflux disease)   . Heart murmur   . Plantar wart of right foot   . Schizophrenia [facility])     Patient Active Problem List   Diagnosis Date Noted  . Tobacco use disorder 05/05/2017  . Status post ORIF of fracture of ankle 06/10/2016  . 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 Paranoid schizophrenia (HCC) 11/02/2014  . Acid reflux 11/02/2014  . Dysphagia 11/02/2014  . Disorder of mitral valve 11/02/2014    Past Surgical History:  Procedure Laterality Date  . ESOPHAGOGASTRODUODENOSCOPY    . HARDWARE REMOVAL Left 06/04/2016   Procedure: HARDWARE REMOVAL WITH POSSIBLE BONE GRAFTING OF THE DISTAL FIBULAR FRACTURE;  Surgeon: Christena Flake, MD;  Location: [facility] ORS;  Service: Orthopedics;  Laterality: Left;  . ORIF ANKLE FRACTURE Right 08/19/15  . ORIF ANKLE FRACTURE Left 08/19/2015   Procedure: OPEN REDUCTION INTERNAL FIXATION (ORIF) ANKLE FRACTURE;   Surgeon: Christena Flake, MD;  Location: [facility] ORS;  Service: Orthopedics;  Laterality: Left;  . PLANTAR'S WART EXCISION Right     Prior to Admission medications   Medication Sig Start Date End Date Taking? Authorizing Provider  benzonatate (TESSALON) 100 MG capsule Take 1 capsule (100 mg total) by mouth 3 (three) times daily. 05/05/17   Reubin Milan, MD  citalopram (CELEXA) 20 MG tablet TAKE 1 TABLET (20 MG TOTAL) BY MOUTH DAILY. 10/11/15   Reubin Milan, MD  dicyclomine (BENTYL) 20 MG tablet Take 1 tablet (20 mg total) by mouth 3 (three) times daily as needed for spasms. 01/12/19   Emily Filbert, MD  ibuprofen (ADVIL,MOTRIN) 200 MG tablet Take 200 mg by mouth every 6 (six) hours as needed for pain.    [provider]  promethazine (PHENERGAN) 25 MG tablet Take 1 tablet (25 mg total) by mouth every 4 (four) hours as needed for nausea or vomiting. 01/12/19   Emily Filbert, MD    Allergies Tramadol  Family History  Problem Relation Age of Onset  . Hypertension Mother   . Diabetes Father     Social History Social History   Tobacco Use  . Smoking status: Current Every Day Smoker    Packs/day: 2.00    Years: 12.00    Pack years: 24.00    Types: Cigarettes  . Smokeless tobacco: Never Used  Substance Use Topics  . Alcohol use: No    Alcohol/week: 0.0 standard drinks  Comment: rare consumption  . Drug use: No    Review of Systems  Review of Systems  Constitutional: Positive for fatigue. Negative for chills and fever.  HENT: Negative for sore throat.   Respiratory: Negative for shortness of breath.   Cardiovascular: Negative for chest pain.  Gastrointestinal: Negative for abdominal pain.  Genitourinary: Negative for flank pain.  Musculoskeletal: Negative for neck pain.  Skin: Negative for rash and wound.  Allergic/Immunologic: Negative for immunocompromised state.  Neurological: Negative for weakness and numbness.  Hematological: Does not  bruise/bleed easily.  Psychiatric/Behavioral: Positive for behavioral problems, hallucinations and suicidal ideas. The patient is nervous/anxious.   All other systems reviewed and are negative.    ____________________________________________  PHYSICAL EXAM:      VITAL SIGNS: ED Triage Vitals  Enc Vitals Group     BP 04/01/19 0646 129/76     Pulse Rate 04/01/19 0646 83     Resp 04/01/19 0646 18     Temp 04/01/19 0646 98.7 F (37.1 C)     Temp Source 04/01/19 0646 Oral     SpO2 04/01/19 0646 100 %     Weight 04/01/19 0647 120 lb (54.4 kg)     Height 04/01/19 0647 5\' 6"  (1.676 m)     Head Circumference --      Peak Flow --      Pain Score 04/01/19 0646 0     Pain Loc --      Pain Edu? --      Excl. in [place]? --      Physical Exam Vitals and nursing note reviewed.  Constitutional:      General: He is not in acute distress.    Appearance: He is well-developed.  HENT:     Head: Normocephalic and atraumatic.  Eyes:     Conjunctiva/sclera: Conjunctivae normal.  Cardiovascular:     Rate and Rhythm: Normal rate and regular rhythm.     Heart sounds: Normal heart sounds. No murmur. No friction rub.  Pulmonary:     Effort: Pulmonary effort is normal. No respiratory distress.     Breath sounds: Normal breath sounds. No wheezing or rales.  Abdominal:     General: There is no distension.     Palpations: Abdomen is soft.     Tenderness: There is no abdominal tenderness.  Musculoskeletal:     Cervical back: Neck supple.  Skin:    General: Skin is warm.     Capillary Refill: Capillary refill takes less than 2 seconds.  Neurological:     Mental Status: He is alert and oriented to person, place, and time.     Motor: No abnormal muscle tone.  Psychiatric:        Speech: Speech normal.        Behavior: Behavior is cooperative.        Thought Content: Thought content is paranoid.        Judgment: Judgment is impulsive and inappropriate.        ____________________________________________   LABS (all labs ordered are listed, but only abnormal results are displayed)  Labs Reviewed  COMPREHENSIVE METABOLIC PANEL - Abnormal; Notable for the following components:      Result Value   Potassium 3.1 (*)    Chloride 95 (*)    CO2 33 (*)    All other components within normal limits  URINE DRUG SCREEN, QUALITATIVE ([facility] ONLY) - Abnormal; Notable for the following components:   Amphetamines, Ur Screen POSITIVE (*)  Opiate, Ur Screen POSITIVE (*)    All other components within normal limits  RESPIRATORY PANEL BY RT PCR (FLU A&B, COVID)  ETHANOL  CBC WITH DIFFERENTIAL/PLATELET    ____________________________________________  EKG: None ________________________________________  RADIOLOGY All imaging, including plain films, CT scans, and ultrasounds, independently reviewed by me, and interpretations confirmed via formal radiology reads.  ED MD interpretation:   None  Official radiology report(s): No results found.  ____________________________________________  PROCEDURES   Procedure(s) performed (including Critical Care):  Procedures  ____________________________________________  INITIAL IMPRESSION / MDM / ASSESSMENT AND PLAN / ED COURSE  As part of my medical decision making, I reviewed the following data within the electronic MEDICAL RECORD NUMBER Nursing notes reviewed and incorporated, Old chart reviewed, Notes from prior ED visits, and [place] Controlled Substance Database       *Shedrick A Jungbluth was evaluated in Emergency Department on 04/01/2019 for the symptoms described in the history of present illness. He was evaluated in the context of the global COVID-19 pandemic, which necessitated consideration that the patient might be at risk for infection with the SARS-CoV-2 virus that causes COVID-19. Institutional protocols and algorithms that pertain to the evaluation of patients at risk for COVID-19 are in a state of  rapid change based on information released by regulatory bodies including the CDC and federal and state organizations. These policies and algorithms were followed during the patient's care in the ED.  Some ED evaluations and interventions may be delayed as a result of limited staffing during the pandemic.*     Medical Decision Making:  34 yo M here with reported AVH, paranoia, anxiety off meds. He has chronic n/v but this is not acutely worsened, no other medical complaints. Labs show mild hypoK - replaced. Medically stable for psychiatric disposition. Here voluntarily at this time, denies overt SI, HI though does report some passive concerns re: impulse control and anger. Has been reportedly off meds since leaving jail 7 mo ago.  ____________________________________________  FINAL CLINICAL IMPRESSION(S) / ED DIAGNOSES  Final diagnoses:  Paranoid schizophrenia (HCC)     MEDICATIONS GIVEN DURING THIS VISIT:  Medications  potassium chloride SA (KLOR-CON) CR tablet 40 mEq (has no administration in time range)     ED Discharge Orders    None       Note:  This document was prepared using Dragon voice recognition software and may include unintentional dictation errors.   Shaune Pollack, MD 04/01/19 [phone]

## 2019-04-01 NOTE — ED Notes (Signed)
Patient pacing in hallway.

## 2019-04-01 NOTE — Consult Note (Signed)
[facility] Face-to-Face Psychiatry Consult   Reason for Consult: Bizarre behavior history of schizophrenia Referring Physician: Dr. Ellender Hose Patient Identification: Lucas Guerra MRN:  [id number] Principal Diagnosis: <principal problem not specified> Diagnosis:  Active Problems:   * No active hospital problems. *   Total Time spent with patient: 30 minutes  Subjective:   Lucas Guerra is a 34 y.o. male patient admitted with anxiety, paranoia, and AVH.  HPI:    Per ED provider "Lucas Guerra is a 34 y.o. male  Here with auditory hallucinations, panic attacks. Pt reports that since leaving jail 7 mo ago, he has not been on any of his psych meds. He has had progressively worsening hallucinations since then with voices telling him to hurt himself and others. He has been attempting to self medicate by distracting himself, trying to "stay busy" without success. He denies any SI or thoughts to harm himself but does state he has been more angry, agitated, and doesn't know what he would do if provoked in terms of HI. Admit to occasional drug use. He has some chronic n/v which is not acutely worsened. No other complaints."  Patient's behavior was such that he was given an IM injection of Haldol Benadryl and Ativan.  Upon evaluation approximately an hour later patient was still awake although with bizarre affect.  He was extremely vague with his psychotic symptoms and appeared guarded and hesitant to explain.  At first he stated that he was hearing voices then later stated he had heard voices years ago.  Denies feeling suicidal at this time however mood appears very labile.  Patient appeared internally preoccupied at times and was making some delusional and nonsensical statements during the evaluation.  Patient was however agreeable to stay inpatient and therefore was offered voluntary admission.  Past Psychiatric History: Patient acknowledges a history of schizophrenia and medications.  He is unsure which  medications he is taking but does acknowledge previous trials of Zyprexa and risperidone.  Risk to Self:  Yes Risk to Others:  Yes Prior Inpatient Therapy:  Yes Prior Outpatient Therapy:  Yes  Past Medical History:  Past Medical History:  Diagnosis Date  . Anxiety   . GERD (gastroesophageal reflux disease)   . Heart murmur   . Plantar wart of right foot   . Schizophrenia [facility])     Past Surgical History:  Procedure Laterality Date  . ESOPHAGOGASTRODUODENOSCOPY    . HARDWARE REMOVAL Left 06/04/2016   Procedure: HARDWARE REMOVAL WITH POSSIBLE BONE GRAFTING OF THE DISTAL FIBULAR FRACTURE;  Surgeon: Corky Mull, MD;  Location: [facility] ORS;  Service: Orthopedics;  Laterality: Left;  . ORIF ANKLE FRACTURE Right 08/19/15  . ORIF ANKLE FRACTURE Left 08/19/2015   Procedure: OPEN REDUCTION INTERNAL FIXATION (ORIF) ANKLE FRACTURE;  Surgeon: Corky Mull, MD;  Location: [facility] ORS;  Service: Orthopedics;  Laterality: Left;  . PLANTAR'S WART EXCISION Right    Family History:  Family History  Problem Relation Age of Onset  . Hypertension Mother   . Diabetes Father    Family Psychiatric  History: Unknown Social History:  Social History   Substance and Sexual Activity  Alcohol Use No  . Alcohol/week: 0.0 standard drinks   Comment: rare consumption     Social History   Substance and Sexual Activity  Drug Use No    Social History   Socioeconomic History  . Marital status: Single    Spouse name: Not on file  . Number of children: Not on file  .  Years of education: Not on file  . Highest education level: Not on file  Occupational History  . Not on file  Tobacco Use  . Smoking status: Current Every Day Smoker    Packs/day: 2.00    Years: 12.00    Pack years: 24.00    Types: Cigarettes  . Smokeless tobacco: Never Used  Substance and Sexual Activity  . Alcohol use: No    Alcohol/week: 0.0 standard drinks    Comment: rare consumption  . Drug use: No  . Sexual activity: Not on file   Other Topics Concern  . Not on file  Social History Narrative  . Not on file   Social Determinants of Health   Financial Resource Strain:   . Difficulty of Paying Living Expenses: Not on file  Food Insecurity:   . Worried About Programme researcher, broadcasting/film/video in the Last Year: Not on file  . Ran Out of Food in the Last Year: Not on file  Transportation Needs:   . Lack of Transportation (Medical): Not on file  . Lack of Transportation (Non-Medical): Not on file  Physical Activity:   . Days of Exercise per Week: Not on file  . Minutes of Exercise per Session: Not on file  Stress:   . Feeling of Stress : Not on file  Social Connections:   . Frequency of Communication with Friends and Family: Not on file  . Frequency of Social Gatherings with Friends and Family: Not on file  . Attends Religious Services: Not on file  . Active Member of Clubs or Organizations: Not on file  . Attends Banker Meetings: Not on file  . Marital Status: Not on file   Additional Social History: Patient reports leaving jail approximately 7 months ago.  Does not want to give details regarding offenses.    Allergies:   Allergies  Allergen Reactions  . Tramadol Rash    Labs:  Results for orders placed or performed during the hospital encounter of 04/01/19 (from the past 48 hour(s))  Comprehensive metabolic panel     Status: Abnormal   Collection Time: 04/01/19  6:56 AM  Result Value Ref Range   Sodium 137 135 - 145 mmol/L   Potassium 3.1 (L) 3.5 - 5.1 mmol/L   Chloride 95 (L) 98 - 111 mmol/L   CO2 33 (H) 22 - 32 mmol/L   Glucose, Bld 82 70 - 99 mg/dL   BUN 19 6 - 20 mg/dL   Creatinine, Ser 3.78 0.61 - 1.24 mg/dL   Calcium 9.4 8.9 - 58.8 mg/dL   Total Protein 7.1 6.5 - 8.1 g/dL   Albumin 4.0 3.5 - 5.0 g/dL   AST 16 15 - 41 U/L   ALT 13 0 - 44 U/L   Alkaline Phosphatase 69 38 - 126 U/L   Total Bilirubin 0.6 0.3 - 1.2 mg/dL   GFR calc non Af Amer >60 >60 mL/min   GFR calc Af Amer >60 >60  mL/min   Anion gap 9 5 - 15    Comment: Performed at [facility], [address], [place], [place] [address]  Ethanol     Status: None   Collection Time: 04/01/19  6:56 AM  Result Value Ref Range   Alcohol, Ethyl (B) <10 <10 mg/dL    Comment: (NOTE) Lowest detectable limit for serum alcohol is 10 mg/dL. For medical purposes only. Performed at [facility], [address], [place], [place] [address]   Urine Drug Screen,  Qualitative     Status: Abnormal   Collection Time: 04/01/19  6:56 AM  Result Value Ref Range   Tricyclic, Ur Screen NONE DETECTED NONE DETECTED   Amphetamines, Ur Screen POSITIVE (A) NONE DETECTED   MDMA (Ecstasy)Ur Screen NONE DETECTED NONE DETECTED   Cocaine Metabolite,Ur [place] NONE DETECTED NONE DETECTED   Opiate, Ur Screen POSITIVE (A) NONE DETECTED   Phencyclidine (PCP) Ur S NONE DETECTED NONE DETECTED   Cannabinoid 50 Ng, Ur [place] NONE DETECTED NONE DETECTED   Barbiturates, Ur Screen NONE DETECTED NONE DETECTED   Benzodiazepine, Ur Scrn NONE DETECTED NONE DETECTED   Methadone Scn, Ur NONE DETECTED NONE DETECTED    Comment: (NOTE) Tricyclics + metabolites, urine    Cutoff 1000 ng/mL Amphetamines + metabolites, urine  Cutoff 1000 ng/mL MDMA (Ecstasy), urine              Cutoff 500 ng/mL Cocaine Metabolite, urine          Cutoff 300 ng/mL Opiate + metabolites, urine        Cutoff 300 ng/mL Phencyclidine (PCP), urine         Cutoff 25 ng/mL Cannabinoid, urine                 Cutoff 50 ng/mL Barbiturates + metabolites, urine  Cutoff 200 ng/mL Benzodiazepine, urine              Cutoff 200 ng/mL Methadone, urine                   Cutoff 300 ng/mL The urine drug screen provides only a preliminary, unconfirmed analytical test result and should not be used for non-medical purposes. Clinical consideration and professional judgment should be applied to any positive drug screen result due to possible interfering substances. A more specific alternate  chemical method must be used in order to obtain a confirmed analytical result. Gas chromatography / mass spectrometry (GC/MS) is the preferred confirmat ory method. Performed at [facility], [address] [place], [place] [address]   CBC with Diff     Status: None   Collection Time: 04/01/19  6:56 AM  Result Value Ref Range   WBC 6.4 4.0 - 10.5 K/uL   RBC 4.69 4.22 - 5.81 MIL/uL   Hemoglobin 13.5 13.0 - 17.0 g/dL   HCT 41.9 62.2 - 29.7 %   MCV 85.9 80.0 - 100.0 fL   MCH 28.8 26.0 - 34.0 pg   MCHC 33.5 30.0 - 36.0 g/dL   RDW 98.9 21.1 - 94.1 %   Platelets 217 150 - 400 K/uL   nRBC 0.0 0.0 - 0.2 %   Neutrophils Relative % 37 %   Neutro Abs 2.5 1.7 - 7.7 K/uL   Lymphocytes Relative 53 %   Lymphs Abs 3.5 0.7 - 4.0 K/uL   Monocytes Relative 6 %   Monocytes Absolute 0.4 0.1 - 1.0 K/uL   Eosinophils Relative 3 %   Eosinophils Absolute 0.2 0.0 - 0.5 K/uL   Basophils Relative 1 %   Basophils Absolute 0.0 0.0 - 0.1 K/uL   WBC Morphology MORPHOLOGY UNREMARKABLE    RBC Morphology MORPHOLOGY UNREMARKABLE    Smear Review Normal platelet morphology    Immature Granulocytes 0 %   Abs Immature Granulocytes 0.01 0.00 - 0.07 K/uL    Comment: Performed at [facility], [address], [place], [place] [address]  Troponin I (High Sensitivity)     Status: None   Collection Time: 04/01/19  6:56 AM  Result Value Ref Range   Troponin I (High Sensitivity) <2 <18 ng/L    Comment: (NOTE) Elevated high sensitivity troponin I (hsTnI) values and significant  changes across serial measurements may suggest ACS but many other  chronic and acute conditions are known to elevate hsTnI results.  Refer to the "Links" section for chest pain algorithms and additional  guidance. Performed at [facility], [address] [place], [place] [address]   Respiratory Panel by RT PCR (Flu A&B, Covid) - Nasopharyngeal Swab     Status: None   Collection Time: 04/01/19  8:35 AM    Specimen: Nasopharyngeal Swab  Result Value Ref Range   SARS Coronavirus 2 by RT PCR NEGATIVE NEGATIVE    Comment: (NOTE) SARS-CoV-2 target nucleic acids are NOT DETECTED. The SARS-CoV-2 RNA is generally detectable in upper respiratoy specimens during the acute phase of infection. The lowest concentration of SARS-CoV-2 viral copies this assay can detect is 131 copies/mL. A negative result does not preclude SARS-Cov-2 infection and should not be used as the sole basis for treatment or other patient management decisions. A negative result may occur with  improper specimen collection/handling, submission of specimen other than nasopharyngeal swab, presence of viral mutation(s) within the areas targeted by this assay, and inadequate number of viral copies (<131 copies/mL). A negative result must be combined with clinical observations, patient history, and epidemiological information. The expected result is Negative. Fact Sheet for Patients:  https://www.moore.com/ Fact Sheet for Healthcare Providers:  https://www.young.biz/ This test is not yet ap proved or cleared by the [place] FDA and  has been authorized for detection and/or diagnosis of SARS-CoV-2 by FDA under an Emergency Use Authorization (EUA). This EUA will remain  in effect (meaning this test can be used) for the duration of the COVID-19 declaration under Section 564(b)(1) of the Act, 21 U.S.C. section 360bbb-3(b)(1), unless the authorization is terminated or revoked sooner.    Influenza A by PCR NEGATIVE NEGATIVE   Influenza B by PCR NEGATIVE NEGATIVE    Comment: (NOTE) The Xpert Xpress SARS-CoV-2/FLU/RSV assay is intended as an aid in  the diagnosis of influenza from Nasopharyngeal swab specimens and  should not be used as a sole basis for treatment. Nasal washings and  aspirates are unacceptable for Xpert Xpress SARS-CoV-2/FLU/RSV  testing. Fact Sheet for  Patients: https://www.moore.com/ Fact Sheet for Healthcare Providers: https://www.young.biz/ This test is not yet approved or cleared by the [place] FDA and  has been authorized for detection and/or diagnosis of SARS-CoV-2 by  FDA under an Emergency Use Authorization (EUA). This EUA will remain  in effect (meaning this test can be used) for the duration of the  Covid-19 declaration under Section 564(b)(1) of the Act, 21  U.S.C. section 360bbb-3(b)(1), unless the authorization is  terminated or revoked. Performed at [facility], [address] [place], [place] [address]     No current facility-administered medications for this encounter.   Current Outpatient Medications  Medication Sig Dispense Refill  . benzonatate (TESSALON) 100 MG capsule Take 1 capsule (100 mg total) by mouth 3 (three) times daily. 20 capsule 0  . citalopram (CELEXA) 20 MG tablet TAKE 1 TABLET (20 MG TOTAL) BY MOUTH DAILY. 30 tablet 0  . dicyclomine (BENTYL) 20 MG tablet Take 1 tablet (20 mg total) by mouth 3 (three) times daily as needed for spasms. 20 tablet 1  . ibuprofen (ADVIL,MOTRIN) 200 MG tablet Take 200 mg by mouth every 6 (six) hours as  needed for pain.    . promethazine (PHENERGAN) 25 MG tablet Take 1 tablet (25 mg total) by mouth every 4 (four) hours as needed for nausea or vomiting. 30 tablet 1    Musculoskeletal: Strength & Muscle Tone: within normal limits Gait & Station: normal Patient leans: Backward  Psychiatric Specialty Exam: Physical Exam  Review of Systems  Blood pressure (!) 159/72, pulse 75, temperature 98.7 F (37.1 C), temperature source Oral, resp. rate 18, height 5\' 6"  (1.676 m), weight 54.4 kg, SpO2 100 %.Body mass index is 19.37 kg/m.  General Appearance: Guarded  Eye Contact:  Fair  Speech:  Normal Rate  Volume:  Normal  Mood:  Euphoric  Affect:  Inappropriate and Labile  Thought Process:  Linear  Orientation:  Full  (Time, Place, and Person)  Thought Content:  Illogical, Delusions, Obsessions, Paranoid Ideation and Rumination  Suicidal Thoughts:  No  Homicidal Thoughts:  No  Memory:  Recent;   Fair  Judgement:  Impaired  Insight:  Lacking  Psychomotor Activity:  Normal  Concentration:  Concentration: Fair  Recall:  of Knowledge:  Fair  Language:  Fair  Akathisia:  No  Handed:  Right  AIMS (if indicated):     Assets:  Communication Skills Desire for Improvement  ADL's:  Intact  Cognition:  WNL  Sleep:        Treatment Plan Summary: 34 year old male with history of schizophrenia not currently taking medications presenting psychotic.  Patient will require inpatient hospitalization for safety stabilization medication management.  For the time being patient has signed a voluntary admission because he does acknowledge that he is currently not within his mind and requesting to feel better.  In case this changes there will be a low threshold to commit this patient as he is deemed a danger to himself or others at this time.  Diagnosis: Schizophrenia  Disposition: Recommend psychiatric Inpatient admission when medically cleared.  32, MD 04/01/2019 12:07 PM

## 2019-04-01 NOTE — ED Triage Notes (Signed)
Pt presents with auditory hallucinations for the past several weeks but worse the past 2 weeks and frequent panic attacks. Pt states he came off his meds when he was in jail for a little over a year. Denies SI. Pt states he doesn't think he is HI but he isnt sure if he was provoked what he would actually do.

## 2019-04-01 NOTE — ED Notes (Signed)
Pt dressed in burgundy scrubs.  Pts belongings, which include boots, socks, pants underwear, ss shirt, Ls shirt, hoodie, overalls, hat, wallet, no cash, cell phone, candy, keys, face mask, cigs, lighter.  Items bagged, labelled and placed at nurses station.  Pt has a total of 2 bags.

## 2019-04-02 DIAGNOSIS — F200 Paranoid schizophrenia: Secondary | ICD-10-CM

## 2019-04-02 LAB — HEPATIC FUNCTION PANEL
ALT: 14 U/L (ref 0–44)
AST: 26 U/L (ref 15–41)
Albumin: 3.6 g/dL (ref 3.5–5.0)
Alkaline Phosphatase: 66 U/L (ref 38–126)
Bilirubin, Direct: <0.1 mg/dL (ref 0.0–0.2)
Total Bilirubin: 0.4 mg/dL (ref 0.3–1.2)
Total Protein: 6.6 g/dL (ref 6.5–8.1)

## 2019-04-02 LAB — LIPID PANEL
Cholesterol: 157 mg/dL (ref 0–200)
HDL: 44 mg/dL (ref >40)
LDL Cholesterol: 91 mg/dL (ref 0–99)
Total CHOL/HDL Ratio: 3.6 RATIO
Triglycerides: 108 mg/dL (ref <150)
VLDL: 22 mg/dL (ref 0–40)

## 2019-04-02 LAB — TSH: TSH: 0.057 u[IU]/mL — ABNORMAL LOW (ref 0.350–4.500)

## 2019-04-02 MED ORDER — DICYCLOMINE HCL 20 MG PO TABS: 20.0000 mg | ORAL_TABLET | Freq: Four times a day (QID) | ORAL | Status: DC | PRN

## 2019-04-02 MED ORDER — LOPERAMIDE HCL 2 MG PO CAPS: 2.0000 mg | ORAL_CAPSULE | ORAL | Status: DC | PRN

## 2019-04-02 MED ORDER — HYDROXYZINE HCL 25 MG PO TABS
25.0000 mg | ORAL_TABLET | Freq: Four times a day (QID) | ORAL | Status: DC | PRN
  Administered 2019-04-02 – 2019-04-03 (×4): 25 mg via ORAL
  Filled 2019-04-02 (×4): qty 1

## 2019-04-02 MED ORDER — NAPROXEN 500 MG PO TABS: 500.0000 mg | ORAL_TABLET | Freq: Two times a day (BID) | ORAL | Status: DC | PRN

## 2019-04-02 MED ORDER — ONDANSETRON 4 MG PO TBDP: 4.0000 mg | ORAL_TABLET | Freq: Four times a day (QID) | ORAL | Status: DC | PRN

## 2019-04-02 MED ORDER — METHOCARBAMOL 500 MG PO TABS: 500.0000 mg | ORAL_TABLET | Freq: Three times a day (TID) | ORAL | Status: DC | PRN

## 2019-04-02 NOTE — Plan of Care (Signed)
Patient aware of information received , verbalize understanding of information received  regarding [place] . Emotional and mental status improved . Voice of no safety. Staff educated patient on  medication . No anger outburst     Problem: Education: Goal: Knowledge of [place] Education information/materials will improve Outcome: Progressing Goal: Emotional status will improve Outcome: Progressing Goal: Mental status will improve Outcome: Progressing Goal: Verbalization of understanding the information provided will improve Outcome: Progressing   Problem: Safety: Goal: Periods of time without injury will increase Outcome: Progressing   Problem: Education: Goal: Knowledge of the prescribed therapeutic regimen will improve Outcome: Progressing   Problem: Safety: Goal: Ability to redirect hostility and anger into socially appropriate behaviors will improve Outcome: Progressing Goal: Ability to remain free from injury will improve Outcome: Progressing

## 2019-04-02 NOTE — Progress Notes (Signed)
D:Schizophernia  A: Patient stated slept good last night .Stated appetite good and energy level  normal. Stated concentration is good . Patient denies  Depression , hopeless and anxiety . Denies suicidal  homicidal ideations  . Denies  auditory hallucinations  No pain concerns . No ADL'S. Limited  Interacting with peers and staff.  Patient wanting to sleep this am shift . Patient  Stated he was here to clear his head  And feels he has done this . Patient able to meet with MD this am . Patient aware of information received , verbalize understanding of information received  regarding  . Emotional and mental status improved . Voice of no safety. Staff educated patient on  medication . No anger outburst        Encourage patient participation with unit programming . Instruction  Given on  Medication , verbalize understanding.  R: Voice no other concerns. Staff continue to monitor

## 2019-04-02 NOTE — H&P (Signed)
Psychiatric Admission Assessment Adult  Patient Identification: Lucas Guerra MRN:  [id number] Date of Evaluation:  04/02/2019 Chief Complaint:  Schizophrenia (HCC) [F20.9] Principal Diagnosis: <principal problem not specified> Diagnosis:  Active Problems:   Schizophrenia (HCC)  History of Present Illness: Patient is seen and examined.  Patient is a 34 year old male with a reported past psychiatric history significant for schizophrenia and substance abuse who presented to the [facility] emergency department on 04/01/2019 with self-reported hallucinations and panic attacks.  The patient stated he had been in jail for a year, and been off his medications.  During his time in the emergency department he became agitated and angry.  He required intramuscular medications.  He had been given a intramuscular injection of Haldol, Benadryl and Ativan.  He was sedated at psychiatric evaluation.  He still appeared to be bizarre.  He appeared to be delusional and nonsensical.  His drug screen was positive for amphetamines and opiates.  He had admitted that he had used heroin on the date of admission.  Review of the electronic medical record revealed outpatient visits for schizophrenia, but there is no medication listed there.  He was given Zyprexa 5 mg p.o. twice daily on his admission orders.  This morning he is pleasant and is asking for discharge.  He denied any auditory or visual hallucinations.  He denied any suicidal or homicidal ideation.  He was admitted to the hospital for evaluation and stabilization.  Associated Signs/Symptoms: Depression Symptoms:  disturbed sleep, (Hypo) Manic Symptoms:  Impulsivity, Irritable Mood, Labiality of Mood, Anxiety Symptoms:  Denied Psychotic Symptoms:  Delusions, PTSD Symptoms: Negative Total Time spent with patient: 45 minutes  Past Psychiatric History: There notes in the electronic medical record that reveal that he has been diagnosed with  schizophrenia as far back as 2017 04/2016.  An office visit in July 2016 had him diagnosed with major depressive disorder.  Is the patient at risk to self? No.  Has the patient been a risk to self in the past 6 months? No.  Has the patient been a risk to self within the distant past? No.  Is the patient a risk to others? No.  Has the patient been a risk to others in the past 6 months? No.  Has the patient been a risk to others within the distant past? No.   Prior Inpatient Therapy:   Prior Outpatient Therapy:    Alcohol Screening: 1. How often do you have a drink containing alcohol?: Never 2. How many drinks containing alcohol do you have on a typical day when you are drinking?: 1 or 2 3. How often do you have six or more drinks on one occasion?: Never AUDIT-C Score: 0 4. How often during the last year have you found that you were not able to stop drinking once you had started?: Never 5. How often during the last year have you failed to do what was normally expected from you becasue of drinking?: Never 6. How often during the last year have you needed a first drink in the morning to get yourself going after a heavy drinking session?: Never 7. How often during the last year have you had a feeling of guilt of remorse after drinking?: Never 8. How often during the last year have you been unable to remember what happened the night before because you had been drinking?: Never 9. Have you or someone else been injured as a result of your drinking?: No 10. Has a relative or friend  or a doctor or another health worker been concerned about your drinking or suggested you cut down?: No Alcohol Use Disorder Identification Test Final Score (AUDIT): 0 Alcohol Brief Interventions/Follow-up: AUDIT Score <7 follow-up not indicated Substance Abuse History in the last 12 months:  Yes.   Consequences of Substance Abuse: Current hospitalization clearly influenced by the use of substances. Previous Psychotropic  Medications: Yes  Psychological Evaluations: Yes  Past Medical History:  Past Medical History:  Diagnosis Date  . Anxiety   . GERD (gastroesophageal reflux disease)   . Heart murmur   . Plantar wart of right foot   . Schizophrenia [facility])     Past Surgical History:  Procedure Laterality Date  . ESOPHAGOGASTRODUODENOSCOPY    . HARDWARE REMOVAL Left 06/04/2016   Procedure: HARDWARE REMOVAL WITH POSSIBLE BONE GRAFTING OF THE DISTAL FIBULAR FRACTURE;  Surgeon: Christena Flake, MD;  Location: [facility] ORS;  Service: Orthopedics;  Laterality: Left;  . ORIF ANKLE FRACTURE Right 08/19/15  . ORIF ANKLE FRACTURE Left 08/19/2015   Procedure: OPEN REDUCTION INTERNAL FIXATION (ORIF) ANKLE FRACTURE;  Surgeon: Christena Flake, MD;  Location: [facility] ORS;  Service: Orthopedics;  Laterality: Left;  . PLANTAR'S WART EXCISION Right    Family History:  Family History  Problem Relation Age of Onset  . Hypertension Mother   . Diabetes Father    Family Psychiatric  History: Denied Tobacco Screening: Have you used any form of tobacco in the last 30 days? (Cigarettes, Smokeless Tobacco, Cigars, and/or Pipes): No Social History:  Social History   Substance and Sexual Activity  Alcohol Use No  . Alcohol/week: 0.0 standard drinks   Comment: rare consumption     Social History   Substance and Sexual Activity  Drug Use No    Additional Social History: Marital status: Single Does patient have children?: Yes How many children?: 1 How is patient's relationship with their children?: Pt reports daughter age 30, says he has a good relationship with the child                         Allergies:   Allergies  Allergen Reactions  . Tramadol Rash   Lab Results:  Results for orders placed or performed during the hospital encounter of 04/01/19 (from the past 48 hour(s))  Comprehensive metabolic panel     Status: Abnormal   Collection Time: 04/01/19  6:56 AM  Result Value Ref Range   Sodium 137 135 - 145 mmol/L    Potassium 3.1 (L) 3.5 - 5.1 mmol/L   Chloride 95 (L) 98 - 111 mmol/L   CO2 33 (H) 22 - 32 mmol/L   Glucose, Bld 82 70 - 99 mg/dL   BUN 19 6 - 20 mg/dL   Creatinine, Ser 3.33 0.61 - 1.24 mg/dL   Calcium 9.4 8.9 - 54.5 mg/dL   Total Protein 7.1 6.5 - 8.1 g/dL   Albumin 4.0 3.5 - 5.0 g/dL   AST 16 15 - 41 U/L   ALT 13 0 - 44 U/L   Alkaline Phosphatase 69 38 - 126 U/L   Total Bilirubin 0.6 0.3 - 1.2 mg/dL   GFR calc non Af Amer >60 >60 mL/min   GFR calc Af Amer >60 >60 mL/min   Anion gap 9 5 - 15    Comment: Performed at [facility], [address] [place], [place] [address]  Ethanol     Status: None   Collection Time: 04/01/19  6:56 AM  Result Value Ref Range   Alcohol, Ethyl (B) <10 <10 mg/dL    Comment: (NOTE) Lowest detectable limit for serum alcohol is 10 mg/dL. For medical purposes only. Performed at [facility], [address] [place], [place] [address]   Urine Drug Screen, Qualitative     Status: Abnormal   Collection Time: 04/01/19  6:56 AM  Result Value Ref Range   Tricyclic, Ur Screen NONE DETECTED NONE DETECTED   Amphetamines, Ur Screen POSITIVE (A) NONE DETECTED   MDMA (Ecstasy)Ur Screen NONE DETECTED NONE DETECTED   Cocaine Metabolite,Ur [place] NONE DETECTED NONE DETECTED   Opiate, Ur Screen POSITIVE (A) NONE DETECTED   Phencyclidine (PCP) Ur S NONE DETECTED NONE DETECTED   Cannabinoid 50 Ng, Ur [place] NONE DETECTED NONE DETECTED   Barbiturates, Ur Screen NONE DETECTED NONE DETECTED   Benzodiazepine, Ur Scrn NONE DETECTED NONE DETECTED   Methadone Scn, Ur NONE DETECTED NONE DETECTED    Comment: (NOTE) Tricyclics + metabolites, urine    Cutoff 1000 ng/mL Amphetamines + metabolites, urine  Cutoff 1000 ng/mL MDMA (Ecstasy), urine              Cutoff 500 ng/mL Cocaine Metabolite, urine          Cutoff 300 ng/mL Opiate + metabolites, urine        Cutoff 300 ng/mL Phencyclidine (PCP), urine         Cutoff 25 ng/mL Cannabinoid, urine                  Cutoff 50 ng/mL Barbiturates + metabolites, urine  Cutoff 200 ng/mL Benzodiazepine, urine              Cutoff 200 ng/mL Methadone, urine                   Cutoff 300 ng/mL The urine drug screen provides only a preliminary, unconfirmed analytical test result and should not be used for non-medical purposes. Clinical consideration and professional judgment should be applied to any positive drug screen result due to possible interfering substances. A more specific alternate chemical method must be used in order to obtain a confirmed analytical result. Gas chromatography / mass spectrometry (GC/MS) is the preferred confirmat ory method. Performed at [facility], [address] [place], [place] [address]   CBC with Diff     Status: None   Collection Time: 04/01/19  6:56 AM  Result Value Ref Range   WBC 6.4 4.0 - 10.5 K/uL   RBC 4.69 4.22 - 5.81 MIL/uL   Hemoglobin 13.5 13.0 - 17.0 g/dL   HCT 00.4 59.9 - 77.4 %   MCV 85.9 80.0 - 100.0 fL   MCH 28.8 26.0 - 34.0 pg   MCHC 33.5 30.0 - 36.0 g/dL   RDW 14.2 39.5 - 32.0 %   Platelets 217 150 - 400 K/uL   nRBC 0.0 0.0 - 0.2 %   Neutrophils Relative % 37 %   Neutro Abs 2.5 1.7 - 7.7 K/uL   Lymphocytes Relative 53 %   Lymphs Abs 3.5 0.7 - 4.0 K/uL   Monocytes Relative 6 %   Monocytes Absolute 0.4 0.1 - 1.0 K/uL   Eosinophils Relative 3 %   Eosinophils Absolute 0.2 0.0 - 0.5 K/uL   Basophils Relative 1 %   Basophils Absolute 0.0 0.0 - 0.1 K/uL   WBC Morphology MORPHOLOGY UNREMARKABLE    RBC Morphology MORPHOLOGY UNREMARKABLE    Smear Review Normal platelet morphology  Immature Granulocytes 0 %   Abs Immature Granulocytes 0.01 0.00 - 0.07 K/uL    Comment: Performed at [facility], [place], [place] [address]  Troponin I (High Sensitivity)     Status: None   Collection Time: 04/01/19  6:56 AM  Result Value Ref Range   Troponin I (High Sensitivity) <2 <18 ng/L    Comment: (NOTE) Elevated high  sensitivity troponin I (hsTnI) values and significant  changes across serial measurements may suggest ACS but many other  chronic and acute conditions are known to elevate hsTnI results.  Refer to the "Links" section for chest pain algorithms and additional  guidance. Performed at [facility], [address] [place], [place] [address]   Respiratory Panel by RT PCR (Flu A&B, Covid) - Nasopharyngeal Swab     Status: None   Collection Time: 04/01/19  8:35 AM   Specimen: Nasopharyngeal Swab  Result Value Ref Range   SARS Coronavirus 2 by RT PCR NEGATIVE NEGATIVE    Comment: (NOTE) SARS-CoV-2 target nucleic acids are NOT DETECTED. The SARS-CoV-2 RNA is generally detectable in upper respiratoy specimens during the acute phase of infection. The lowest concentration of SARS-CoV-2 viral copies this assay can detect is 131 copies/mL. A negative result does not preclude SARS-Cov-2 infection and should not be used as the sole basis for treatment or other patient management decisions. A negative result may occur with  improper specimen collection/handling, submission of specimen other than nasopharyngeal swab, presence of viral mutation(s) within the areas targeted by this assay, and inadequate number of viral copies (<131 copies/mL). A negative result must be combined with clinical observations, patient history, and epidemiological information. The expected result is Negative. Fact Sheet for Patients:  PinkCheek.be Fact Sheet for Healthcare Providers:  GravelBags.it This test is not yet ap proved or cleared by the [place] FDA and  has been authorized for detection and/or diagnosis of SARS-CoV-2 by FDA under an Emergency Use Authorization (EUA). This EUA will remain  in effect (meaning this test can be used) for the duration of the COVID-19 declaration under Section 564(b)(1) of the Act, 21 U.S.C. section  360bbb-3(b)(1), unless the authorization is terminated or revoked sooner.    Influenza A by PCR NEGATIVE NEGATIVE   Influenza B by PCR NEGATIVE NEGATIVE    Comment: (NOTE) The Xpert Xpress SARS-CoV-2/FLU/RSV assay is intended as an aid in  the diagnosis of influenza from Nasopharyngeal swab specimens and  should not be used as a sole basis for treatment. Nasal washings and  aspirates are unacceptable for Xpert Xpress SARS-CoV-2/FLU/RSV  testing. Fact Sheet for Patients: PinkCheek.be Fact Sheet for Healthcare Providers: GravelBags.it This test is not yet approved or cleared by the [place] FDA and  has been authorized for detection and/or diagnosis of SARS-CoV-2 by  FDA under an Emergency Use Authorization (EUA). This EUA will remain  in effect (meaning this test can be used) for the duration of the  Covid-19 declaration under Section 564(b)(1) of the Act, 21  U.S.C. section 360bbb-3(b)(1), unless the authorization is  terminated or revoked. Performed at [facility], [address] [place], [place] [address]     Blood Alcohol level:  Lab Results  Component Value Date   [facility] <10 04/01/2019   ETH <5 20/25/4270    Metabolic Disorder Labs:  No results found for: HGBA1C, MPG No results found for: PROLACTIN No results found for: CHOL, TRIG, HDL, CHOLHDL, VLDL, LDLCALC  Current Medications: Current Facility-Administered Medications  Medication Dose Route Frequency Provider Last Rate Last Admin  . acetaminophen (TYLENOL) tablet 650 mg  650 mg Oral Q6H PRN Cristofano, Worthy Rancher, MD      . alum & mag hydroxide-simeth (MAALOX/MYLANTA) 200-200-20 MG/5ML suspension 30 mL  30 mL Oral Q4H PRN Cristofano, Worthy Rancher, MD      . dicyclomine (BENTYL) tablet 20 mg  20 mg Oral Q6H PRN Antonieta Pert, MD      . hydrOXYzine (ATARAX/VISTARIL) tablet 25 mg  25 mg Oral Q6H PRN Antonieta Pert, MD      . loperamide (IMODIUM)  capsule 2-4 mg  2-4 mg Oral PRN Antonieta Pert, MD      . magnesium hydroxide (MILK OF MAGNESIA) suspension 30 mL  30 mL Oral Daily PRN Cristofano, Worthy Rancher, MD      . methocarbamol (ROBAXIN) tablet 500 mg  500 mg Oral Q8H PRN Antonieta Pert, MD      . naproxen (NAPROSYN) tablet 500 mg  500 mg Oral BID PRN Antonieta Pert, MD      . OLANZapine [facility]) tablet 5 mg  5 mg Oral BID Cristofano, Worthy Rancher, MD   5 mg at 04/02/19 0734  . ondansetron (ZOFRAN-ODT) disintegrating tablet 4 mg  4 mg Oral Q6H PRN Antonieta Pert, MD       PTA Medications: Medications Prior to Admission  Medication Sig Dispense Refill Last Dose  . citalopram (CELEXA) 20 MG tablet TAKE 1 TABLET (20 MG TOTAL) BY MOUTH DAILY. (Patient not taking: Reported on 04/01/2019) 30 tablet 0   . dicyclomine (BENTYL) 20 MG tablet Take 1 tablet (20 mg total) by mouth 3 (three) times daily as needed for spasms. (Patient not taking: Reported on 04/01/2019) 20 tablet 1   . ibuprofen (ADVIL,MOTRIN) 200 MG tablet Take 200 mg by mouth every 6 (six) hours as needed for pain.     . promethazine (PHENERGAN) 25 MG tablet Take 1 tablet (25 mg total) by mouth every 4 (four) hours as needed for nausea or vomiting. (Patient not taking: Reported on 04/01/2019) 30 tablet 1     Musculoskeletal: Strength & Muscle Tone: within normal limits Gait & Station: normal Patient leans: N/A  Psychiatric Specialty Exam: Physical Exam  Nursing note and vitals reviewed. Constitutional: He is oriented to person, place, and time. He appears well-developed and well-nourished.  HENT:  Head: Normocephalic and atraumatic.  Respiratory: Effort normal.  Neurological: He is alert and oriented to person, place, and time.    Review of Systems  Blood pressure 134/80, pulse 97, temperature 97.9 F (36.6 C), temperature source Oral, resp. rate 16, height 5\' 6"  (1.676 m), weight 54.4 kg, SpO2 100 %.Body mass index is 19.36 kg/m.  General Appearance: Casual  Eye  Contact:  Fair  Speech:  Normal Rate  Volume:  Normal  Mood:  Euthymic  Affect:  Congruent  Thought Process:  Coherent and Descriptions of Associations: Circumstantial  Orientation:  Full (Time, Place, and Person)  Thought Content:  Negative  Suicidal Thoughts:  No  Homicidal Thoughts:  No  Memory:  Immediate;   Poor Recent;   Poor Remote;   Poor  Judgement:  Impaired  Insight:  Lacking  Psychomotor Activity:  Normal  Concentration:  Concentration: Fair and Attention Span: Fair  Recall:  of Knowledge:  Fair  Language:  Good  Akathisia:  Negative  Handed:  Right  AIMS (if indicated):     Assets:  Desire for Improvement  Resilience  ADL's:  Intact  Cognition:  WNL  Sleep:  Number of Hours: 8.15    Treatment Plan Summary: Daily contact with patient to assess and evaluate symptoms and progress in treatment, Medication management and Plan : Patient is seen and examined.  Patient is a 34 year old male with the above-stated past psychiatric history who is is admitted secondary to agitation, paranoia.  He will be admitted to the unit.  He will be integrated into the milieu.  He will be encouraged to attend groups.  He is denying all symptoms currently, and is requesting discharge.  He stated that he feels as though "back on my meds and good to go".  We will continue the Zyprexa 5 mg p.o. twice daily.  We will monitor his response to this.  As well, given the opiates and reported history of heroin dependency I will start the opiate detox protocol.  His blood pressure so far has been stable, so I will hold off on the clonidine part of the detox.  We will also try and obtain collateral information.  We will monitor how he is doing.  Review of his admission laboratories revealed the positive amphetamines as well as opiates.  Blood alcohol was negative.  CBC was normal.  His potassium was slightly low at 3.1.  The rest of his electrolytes including his liver function enzymes were  normal.  Observation Level/Precautions:  Detox 15 minute checks  Laboratory:  Chemistry Profile  Psychotherapy:    Medications:    Consultations:    Discharge Concerns:    Estimated LOS:  Other:     Physician Treatment Plan for Primary Diagnosis: <principal problem not specified> Long Term Goal(s): Improvement in symptoms so as ready for discharge  Short Term Goals: Ability to identify changes in lifestyle to reduce recurrence of condition will improve, Ability to verbalize feelings will improve, Ability to demonstrate self-control will improve, Ability to identify and develop effective coping behaviors will improve, Ability to maintain clinical measurements within normal limits will improve, Compliance with prescribed medications will improve and Ability to identify triggers associated with substance abuse/mental health issues will improve  Physician Treatment Plan for Secondary Diagnosis: Active Problems:   Schizophrenia (HCC)  Long Term Goal(s): Improvement in symptoms so as ready for discharge  Short Term Goals: Ability to identify changes in lifestyle to reduce recurrence of condition will improve, Ability to verbalize feelings will improve, Ability to demonstrate self-control will improve, Ability to identify and develop effective coping behaviors will improve, Ability to maintain clinical measurements within normal limits will improve, Compliance with prescribed medications will improve and Ability to identify triggers associated with substance abuse/mental health issues will improve  I certify that inpatient services furnished can reasonably be expected to improve the patient's condition.    Antonieta Pert, MD 1/8/202112:28 PM

## 2019-04-02 NOTE — Progress Notes (Signed)
Recreation Therapy Notes  INPATIENT RECREATION THERAPY ASSESSMENT  Patient Details Name: Lucas Guerra MRN: [id number] DOB: [birth year] Today's Date: 04/02/2019       Information Obtained From: Patient  Able to Participate in Assessment/Interview: Yes  Patient Presentation: Responsive  Reason for Admission (Per Patient): Active Symptoms, Med Non-Compliance  Patient Stressors:    Coping Skills:   Exercise, Deep Breathing  Leisure Interests (2+):  Sports - Exercise (Comment)  Frequency of Recreation/Participation: Monthly  Awareness of Community Resources:     Community Resources:     Current Use:    If no, Barriers?:    Expressed Interest in [employer] Information:    [place] of Residence:  Film/video editor  Patient Main Form of Transportation: Set designer  Patient Strengths:  N/A  Patient Identified Areas of Improvement:  N/A  Patient Goal for Hospitalization:  To get medictaion right  Current SI (including self-harm):  No  Current HI:  No  Current AVH: No  Staff Intervention Plan: Group Attendance, Collaborate with Interdisciplinary Treatment Team  Consent to Intern Participation: N/A  Carolynne Schuchard 04/02/2019, 3:01 PM

## 2019-04-02 NOTE — [facility] Group Notes (Signed)
LCSW Group Therapy Note  04/02/2019 1:00 PM  Type of Therapy and Topic:  Group Therapy:  Feelings around Relapse and Recovery  Participation Level:  None   Description of Group:    Patients in this group will discuss emotions they experience before and after a relapse. They will process how experiencing these feelings, or avoidance of experiencing them, relates to having a relapse. Facilitator will guide patients to explore emotions they have related to recovery. Patients will be encouraged to process which emotions are more powerful. They will be guided to discuss the emotional reaction significant others in their lives may have to their relapse or recovery. Patients will be assisted in exploring ways to respond to the emotions of others without this contributing to a relapse.  Therapeutic Goals: 1. Patient will identify two or more emotions that lead to a relapse for them 2. Patient will identify two emotions that result when they relapse 3. Patient will identify two emotions related to recovery 4. Patient will demonstrate ability to communicate their needs through discussion and/or role plays   Summary of Patient Progress: Patient came to group later and stayed for several minutes before leaving again.   Therapeutic Modalities:   Cognitive Behavioral Therapy Solution-Focused Therapy Assertiveness Training Relapse Prevention Therapy   Penni Homans, MSW, LCSW 04/02/2019 12:25 PM

## 2019-04-02 NOTE — [facility] Suicide Risk Assessment (Signed)
[facility] INPATIENT:  Family/Significant Other Suicide Prevention Education  Suicide Prevention Education:  Patient Refusal for Family/Significant Other Suicide Prevention Education: The patient Lucas Guerra has refused to provide written consent for family/significant other to be provided Family/Significant Other Suicide Prevention Education during admission and/or prior to discharge.  Physician notified.  Shavone Nevers T Amiri Tritch 04/02/2019, 9:55 AM

## 2019-04-02 NOTE — Progress Notes (Signed)
Patient was in his room upon arrival to the unit. Patient pleasant during assessment denying SI/HI/AVH, pain and depression. Patient endorses anxiety, (SEE MAR). Patient stated he had a good day and was more active on the unit this evening. Patient observed interacting appropriately with staff and peers. Patient compliant with medication administration per MD orders. Patient given education. Patient given support and encouragement to be active In his treatment plan. Patient being monitored Q 15 minutes for safety per unit protocol. Patient remains safe on the unit.

## 2019-04-02 NOTE — Plan of Care (Signed)
Patient pleasant and cooperative on the unit this evening.   Problem: Education: Goal: Emotional status will improve Outcome: Progressing Goal: Mental status will improve Outcome: Progressing

## 2019-04-02 NOTE — Progress Notes (Signed)
Recreation Therapy Notes  Date: 04/02/2019  Time: 9:30 am   Location: Craft room   Behavioral response: N/A   Intervention Topic: Happiness   Discussion/Intervention: Patient did not attend group.   Clinical Observations/Feedback:  Patient did not attend group.   Taila Basinski LRT/CTRS        Atalaya Zappia 04/02/2019 11:04 AM

## 2019-04-02 NOTE — BHH Counselor (Signed)
Adult Comprehensive Assessment  Patient ID: MARCAS BOWSHER, male   DOB: 25-Oct-1985, 34 y.o.   MRN: [id number]  Information Source: Information source: Patient  Current Stressors:  Patient states their primary concerns and needs for treatment are:: "Clear my head and get on medication" Pt says he has been off medication for three weeks. Denies AH/VH. Denies SI/HI Patient states their goals for this hospitilization and ongoing recovery are:: "Get on my medicine" Educational / Learning stressors: None reported Employment / Job issues: Unemployed Family Relationships: No stressors reported Surveyor, quantity / Lack of resources (include bankruptcy): No income Housing / Lack of housing: Homeless, says he has been living in his car for "couple of days" Physical health (include injuries & life threatening diseases): None reported Social relationships: No stressors reported Substance abuse: Pt denies  Living/Environment/Situation:  Living Arrangements: Alone Living conditions (as described by patient or guardian): Homeless, living in car How long has patient lived in current situation?: "Couple of days" What is atmosphere in current home: Temporary  Family History:  Marital status: Single Does patient have children?: Yes How many children?: 1 How is patient's relationship with their children?: Pt reports daughter age 3, says he has a good relationship with the child  Childhood History:  By whom was/is the patient raised?: Both parents Description of patient's relationship with caregiver when they were a child: "Good" Patient's description of current relationship with people who raised him/her: "Good" Does patient have siblings?: Yes Number of Siblings: 4 Description of patient's current relationship with siblings: Pt reports 3 brothers, 1 sister says he has a "good relationship" with them Did patient suffer any verbal/emotional/physical/sexual abuse as a child?: No Did patient suffer from  severe childhood neglect?: No Has patient ever been sexually abused/assaulted/raped as an adolescent or adult?: No Was the patient ever a victim of a crime or a disaster?: No Witnessed domestic violence?: No Has patient been effected by domestic violence as an adult?: No  Education:  Highest grade of school patient has completed: 8th Currently a student?: No Learning disability?: No  Employment/Work Situation:   Employment situation: Unemployed What is the longest time patient has a held a job?: 2 months Where was the patient employed at that time?: [employer] Did You Receive Any Psychiatric Treatment/Services While in Equities trader?: No Are There Guns or Other Weapons in Your Home?: No Are These Comptroller?: (Pt denies access)  Financial Resources:   Financial resources: No income(Pt says he has medicare but not listed) Does patient have a Lawyer or guardian?: No  Alcohol/Substance Abuse:   What has been your use of drugs/alcohol within the last 12 months?: Pt denies If attempted suicide, did drugs/alcohol play a role in this?: No Alcohol/Substance Abuse Treatment Hx: Denies past history Has alcohol/substance abuse ever caused legal problems?: No  Social Support System:   Forensic psychologist System: Poor Describe Community Support System: "Its just me" Type of faith/religion: Muslim How does patient's faith help to cope with current illness?: Prayer  Leisure/Recreation:   Leisure and Hobbies: "Working"  Strengths/Needs:   What is the patient's perception of their strengths?: "I dont know" Patient states these barriers may affect their return to the community: Homeless  Discharge Plan:   Currently receiving community mental health services: No Patient states concerns and preferences for aftercare planning are: Pt request referral for outpatient treatment, denies having a mental health provider Patient states they will know when they  are safe and ready for discharge when: "Im  ready now" Does patient have access to transportation?: No Does patient have financial barriers related to discharge medications?: Yes(No insurance listed) Plan for no access to transportation at discharge: CSW will assist with transportation Will patient be returning to same living situation after discharge?: (D/C plan TBD)  Summary/Recommendations:   Summary and Recommendations (to be completed by the evaluator): Pt is a 34 yr old male who states coming to the ED to "get on medication." Chart review states the pt has been experiencing auditory hallucinations. Pt denies any current AH/VH or SI/HI. Pt is homeless and is living in his car. Pt denies any substance use. Pt denies any trauma or abuse. Chart review states the pt was released from jail 7 months ago. Pt denies having any social supports. Pt request referral for outpatient treatment.  While here, patient will benefit from crisis stabilization, medication evaluation, group therapy and psychoeducation. In addition, it is recommended that patient remain compliant with the established discharge plan and continue treatment.  Gil Ingwersen Lynelle Smoke. 04/02/2019

## 2019-04-02 NOTE — BHH Suicide Risk Assessment (Signed)
[facility] Admission Suicide Risk Assessment   Nursing information obtained from:  Patient Demographic factors:  Male Current Mental Status:  NA Loss Factors:  NA Historical Factors:  NA Risk Reduction Factors:  NA  Total Time spent with patient: 30 minutes Principal Problem: <principal problem not specified> Diagnosis:  Active Problems:   Schizophrenia ([place])  Subjective Data: Patient is seen and examined.  Patient is a 34 year old male with a reported past psychiatric history significant for schizophrenia and substance abuse who presented to the [facility] emergency department on 04/01/2019 with self-reported hallucinations and panic attacks.  The patient stated he had been in jail for a year, and been off his medications.  During his time in the emergency department he became agitated and angry.  He required intramuscular medications.  He had been given a intramuscular injection of Haldol, Benadryl and Ativan.  He was sedated at psychiatric evaluation.  He still appeared to be bizarre.  He appeared to be delusional and nonsensical.  His drug screen was positive for amphetamines and opiates.  He had admitted that he had used heroin on the date of admission.  Review of the electronic medical record revealed outpatient visits for schizophrenia, but there is no medication listed there.  He was given Zyprexa 5 mg p.o. twice daily on his admission orders.  This morning he is pleasant and is asking for discharge.  He denied any auditory or visual hallucinations.  He denied any suicidal or homicidal ideation.  He was admitted to the hospital for evaluation and stabilization.  Continued Clinical Symptoms:  Alcohol Use Disorder Identification Test Final Score (AUDIT): 0 The "Alcohol Use Disorders Identification Test", Guidelines for Use in Primary Care, Second Edition.  World Pharmacologist [facility]). Score between 0-7:  no or low risk or alcohol related problems. Score between 8-15:   moderate risk of alcohol related problems. Score between 16-19:  high risk of alcohol related problems. Score 20 or above:  warrants further diagnostic evaluation for alcohol dependence and treatment.   CLINICAL FACTORS:   Alcohol/Substance Abuse/Dependencies Schizophrenia:   Less than 73 years old Paranoid or undifferentiated type   Musculoskeletal: Strength & Muscle Tone: within normal limits Gait & Station: normal Patient leans: N/A  Psychiatric Specialty Exam: Physical Exam  Nursing note and vitals reviewed. Constitutional: He is oriented to person, place, and time. He appears well-developed and well-nourished.  HENT:  Head: Normocephalic and atraumatic.  Respiratory: Effort normal.  Neurological: He is alert and oriented to person, place, and time.    Review of Systems  Blood pressure 134/80, pulse 97, temperature 97.9 F (36.6 C), temperature source Oral, resp. rate 16, height 5\' 6"  (1.676 m), weight 54.4 kg, SpO2 100 %.Body mass index is 19.36 kg/m.  General Appearance: Casual  Eye Contact:  Fair  Speech:  Normal Rate  Volume:  Normal  Mood:  Anxious  Affect:  Congruent  Thought Process:  Coherent and Descriptions of Associations: Circumstantial  Orientation:  Full (Time, Place, and Person)  Thought Content:  Logical  Suicidal Thoughts:  No  Homicidal Thoughts:  No  Memory:  Immediate;   Poor Recent;   Poor Remote;   Poor  Judgement:  Impaired  Insight:  Lacking  Psychomotor Activity:  Increased  Concentration:  Concentration: Poor and Attention Span: Poor  Recall:  Poor  Fund of Knowledge:  Fair  Language:  Good  Akathisia:  Negative  Handed:  Right  AIMS (if indicated):     Assets:  Desire  for Improvement Resilience  ADL's:  Intact  Cognition:  WNL  Sleep:  Number of Hours: 8.15      COGNITIVE FEATURES THAT CONTRIBUTE TO RISK:  None    SUICIDE RISK:   Mild:  Suicidal ideation of limited frequency, intensity, duration, and specificity.  There  are no identifiable plans, no associated intent, mild dysphoria and related symptoms, good self-control (both objective and subjective assessment), few other risk factors, and identifiable protective factors, including available and accessible social support.  PLAN OF CARE: Patient is seen and examined.  Patient is a 34 year old male with the above-stated past psychiatric history who is is admitted secondary to agitation, paranoia.  He will be admitted to the unit.  He will be integrated into the milieu.  He will be encouraged to attend groups.  He is denying all symptoms currently, and is requesting discharge.  He stated that he feels as though "back on my meds and good to go".  We will continue the Zyprexa 5 mg p.o. twice daily.  We will monitor his response to this.  As well, given the opiates and reported history of heroin dependency I will start the opiate detox protocol.  His blood pressure so far has been stable, so I will hold off on the clonidine part of the detox.  We will also try and obtain collateral information.  We will monitor how he is doing.  Review of his admission laboratories revealed the positive amphetamines as well as opiates.  Blood alcohol was negative.  CBC was normal.  His potassium was slightly low at 3.1.  The rest of his electrolytes including his liver function enzymes were normal.  I certify that inpatient services furnished can reasonably be expected to improve the patient's condition.   Antonieta Pert, MD 04/02/2019, 10:03 AM

## 2019-04-03 NOTE — Plan of Care (Signed)
Patient is in the milieu. Calm and cooperative. Alert and oriented and denying thoughts of self harm. Denying depression and states " I have never said I was depressed, I run out of medications, and that's why I am here". Patient reports that he is feeling improvement. Motivated for outpatient services after discharge from the hospital. No major concern. Encouragements provided. Safety monitored as recommended.

## 2019-04-03 NOTE — Progress Notes (Signed)
[facility] MD Progress Note  04/03/2019 10:57 AM NORRIS BRUMBACH  MRN:  [id number] Subjective: Patient is a 34 year old male with a reported past psychiatric history significant for schizophrenia and substance abuse who presented to the [facility] emergency department on 04/01/2019 with self-reported hallucinations and panic attacks.  He was significantly agitated and received intermuscular injection of Haldol, Benadryl and lorazepam.  Objective: Patient is seen and examined.  Patient is a 34 year old male with the above-stated past psychiatric history seen in follow-up.  He denied complaint this morning.  He stated he was not having any hallucinations.  He continues to request discharge.  We discussed the fact of the circumstances of his admission in the emergency department, and I told him that I was uncomfortable with let him go this fast.  Additionally he is living in his car, and it is freezing temperatures outside, and I felt uncomfortable with that as well.  He stated he could go to his sister's home, but yesterday he told me that many of his family members will not allow him to live in their homes.  His vital signs are stable, he is afebrile.  He slept 8 hours last night.  He denied any auditory or visual hallucinations.  He denied any suicidal or homicidal ideation.  He denies any side effects to his current medications.  Review of his laboratories revealed normal liver function enzymes, essentially normal lipid panel and drug screen positive for opiates as well as amphetamines.  He remains on the opiate detox protocol as well.  Principal Problem: <principal problem not specified> Diagnosis: Active Problems:   Schizophrenia ([facility])  Total Time spent with patient: 20 minutes  Past Psychiatric History: See admission H&P  Past Medical History:  Past Medical History:  Diagnosis Date  . Anxiety   . GERD (gastroesophageal reflux disease)   . Heart murmur   . Plantar wart of right  foot   . Schizophrenia [facility])     Past Surgical History:  Procedure Laterality Date  . ESOPHAGOGASTRODUODENOSCOPY    . HARDWARE REMOVAL Left 06/04/2016   Procedure: HARDWARE REMOVAL WITH POSSIBLE BONE GRAFTING OF THE DISTAL FIBULAR FRACTURE;  Surgeon: Christena Flake, MD;  Location: [facility] ORS;  Service: Orthopedics;  Laterality: Left;  . ORIF ANKLE FRACTURE Right 08/19/15  . ORIF ANKLE FRACTURE Left 08/19/2015   Procedure: OPEN REDUCTION INTERNAL FIXATION (ORIF) ANKLE FRACTURE;  Surgeon: Christena Flake, MD;  Location: [facility] ORS;  Service: Orthopedics;  Laterality: Left;  . PLANTAR'S WART EXCISION Right    Family History:  Family History  Problem Relation Age of Onset  . Hypertension Mother   . Diabetes Father    Family Psychiatric  History: See admission H&P Social History:  Social History   Substance and Sexual Activity  Alcohol Use No  . Alcohol/week: 0.0 standard drinks   Comment: rare consumption     Social History   Substance and Sexual Activity  Drug Use No    Social History   Socioeconomic History  . Marital status: Single    Spouse name: Not on file  . Number of children: Not on file  . Years of education: Not on file  . Highest education level: Not on file  Occupational History  . Not on file  Tobacco Use  . Smoking status: Current Every Day Smoker    Packs/day: 2.00    Years: 12.00    Pack years: 24.00    Types: Cigarettes  . Smokeless tobacco: Never Used  Substance and Sexual Activity  . Alcohol use: No    Alcohol/week: 0.0 standard drinks    Comment: rare consumption  . Drug use: No  . Sexual activity: Not on file  Other Topics Concern  . Not on file  Social History Narrative  . Not on file   Social Determinants of Health   Financial Resource Strain:   . Difficulty of Paying Living Expenses: Not on file  Food Insecurity:   . Worried About Programme researcher, broadcasting/film/video in the Last Year: Not on file  . Ran Out of Food in the Last Year: Not on file   Transportation Needs:   . Lack of Transportation (Medical): Not on file  . Lack of Transportation (Non-Medical): Not on file  Physical Activity:   . Days of Exercise per Week: Not on file  . Minutes of Exercise per Session: Not on file  Stress:   . Feeling of Stress : Not on file  Social Connections:   . Frequency of Communication with Friends and Family: Not on file  . Frequency of Social Gatherings with Friends and Family: Not on file  . Attends Religious Services: Not on file  . Active Member of Clubs or Organizations: Not on file  . Attends Banker Meetings: Not on file  . Marital Status: Not on file   Additional Social History:                         Sleep: Good  Appetite:  Good  Current Medications: Current Facility-Administered Medications  Medication Dose Route Frequency Provider Last Rate Last Admin  . acetaminophen (TYLENOL) tablet 650 mg  650 mg Oral Q6H PRN Cristofano, Worthy Rancher, MD      . alum & mag hydroxide-simeth (MAALOX/MYLANTA) 200-200-20 MG/5ML suspension 30 mL  30 mL Oral Q4H PRN Cristofano, Paul A, MD      . dicyclomine (BENTYL) tablet 20 mg  20 mg Oral Q6H PRN Antonieta Pert, MD      . hydrOXYzine (ATARAX/VISTARIL) tablet 25 mg  25 mg Oral Q6H PRN Antonieta Pert, MD   25 mg at 04/03/19 0831  . loperamide (IMODIUM) capsule 2-4 mg  2-4 mg Oral PRN Antonieta Pert, MD      . magnesium hydroxide (MILK OF MAGNESIA) suspension 30 mL  30 mL Oral Daily PRN Cristofano, Worthy Rancher, MD      . methocarbamol (ROBAXIN) tablet 500 mg  500 mg Oral Q8H PRN Antonieta Pert, MD      . naproxen (NAPROSYN) tablet 500 mg  500 mg Oral BID PRN Antonieta Pert, MD      . OLANZapine [facility]) tablet 5 mg  5 mg Oral BID Cristofano, Worthy Rancher, MD   5 mg at 04/03/19 0831  . ondansetron (ZOFRAN-ODT) disintegrating tablet 4 mg  4 mg Oral Q6H PRN Antonieta Pert, MD        Lab Results:  Results for orders placed or performed during the hospital  encounter of 04/01/19 (from the past 48 hour(s))  Hepatic function panel     Status: None   Collection Time: 04/02/19  3:11 PM  Result Value Ref Range   Total Protein 6.6 6.5 - 8.1 g/dL   Albumin 3.6 3.5 - 5.0 g/dL   AST 26 15 - 41 U/L   ALT 14 0 - 44 U/L   Alkaline Phosphatase 66 38 - 126 U/L   Total Bilirubin 0.4 0.3 -  1.2 mg/dL   Bilirubin, Direct <0.1 0.0 - 0.2 mg/dL   Indirect Bilirubin NOT CALCULATED 0.3 - 0.9 mg/dL    Comment: Performed at [facility], [address] [place], [place] [address]  Lipid panel     Status: None   Collection Time: 04/02/19  3:11 PM  Result Value Ref Range   Cholesterol 157 0 - 200 mg/dL   Triglycerides 108 <150 mg/dL   HDL 44 >40 mg/dL   Total CHOL/HDL Ratio 3.6 RATIO   VLDL 22 0 - 40 mg/dL   LDL Cholesterol 91 0 - 99 mg/dL    Comment:        Total Cholesterol/HDL:CHD Risk Coronary Heart Disease Risk Table                     Men   Women  1/2 Average Risk   3.4   3.3  Average Risk       5.0   4.4  2 X Average Risk   9.6   7.1  3 X Average Risk  23.4   11.0        Use the calculated Patient Ratio above and the CHD Risk Table to determine the patient's CHD Risk.        ATP III CLASSIFICATION (LDL):  <100     mg/dL   Optimal  100-129  mg/dL   Near or Above                    Optimal  130-159  mg/dL   Borderline  160-189  mg/dL   High  >190     mg/dL   Very High Performed at [facility], [address] [place], [place] [address]   TSH     Status: Abnormal   Collection Time: 04/02/19  3:11 PM  Result Value Ref Range   TSH 0.057 (L) 0.350 - 4.500 uIU/mL    Comment: Performed by a 3rd Generation assay with a functional sensitivity of <=0.01 uIU/mL. Performed at [facility], [address] [place], [place] [address]     Blood Alcohol level:  Lab Results  Component Value Date   [facility] <10 04/01/2019   ETH <5 09/15/7626    Metabolic Disorder Labs: No results found for: HGBA1C, MPG No results found  for: PROLACTIN Lab Results  Component Value Date   CHOL 157 04/02/2019   TRIG 108 04/02/2019   HDL 44 04/02/2019   CHOLHDL 3.6 04/02/2019   VLDL 22 04/02/2019   LDLCALC 91 04/02/2019    Physical Findings: AIMS:  , ,  ,  ,    CIWA:    COWS:  COWS Total Score: 0  Musculoskeletal: Strength & Muscle Tone: within normal limits Gait & Station: normal Patient leans: N/A  Psychiatric Specialty Exam: Physical Exam  Nursing note and vitals reviewed. Constitutional: He is oriented to person, place, and time. He appears well-developed and well-nourished.  HENT:  Head: Normocephalic and atraumatic.  Respiratory: Effort normal.  Neurological: He is alert and oriented to person, place, and time.    Review of Systems  Blood pressure 126/88, pulse 85, temperature 98.2 F (36.8 C), temperature source Oral, resp. rate 18, height 5\' 6"  (1.676 m), weight 54.4 kg, SpO2 100 %.Body mass index is 19.36 kg/m.  General Appearance: Casual  Eye Contact:  Fair  Speech:  Normal Rate  Volume:  Normal  Mood:  Euthymic  Affect:  Congruent  Thought Process:  Coherent  and Descriptions of Associations: Intact  Orientation:  Full (Time, Place, and Person)  Thought Content:  Logical  Suicidal Thoughts:  No  Homicidal Thoughts:  No  Memory:  Immediate;   Fair Recent;   Fair Remote;   Fair  Judgement:  Intact  Insight:  Fair  Psychomotor Activity:  Normal  Concentration:  Concentration: Fair and Attention Span: Fair  Recall:  [employer] of Knowledge:  Fair  Language:  Good  Akathisia:  Negative  Handed:  Right  AIMS (if indicated):     Assets:  Desire for Improvement Resilience  ADL's:  Intact  Cognition:  WNL  Sleep:  Number of Hours: 8     Treatment Plan Summary: Daily contact with patient to assess and evaluate symptoms and progress in treatment, Medication management and Plan : Patient is seen and examined.  Patient is a 34 year old male with the above-stated past psychiatric history  who is seen in follow-up.   Diagnosis: #1 schizophrenia, #2 opiate use disorder, #3 stimulant/amphetamine use disorder.  Patient is seen in follow-up.  He is doing well.  I am concerned about his behavior in the emergency room, but he has been pleasant and cooperative and compliant with medications in the unit.  If things continue to go well we will consider discharge tomorrow or Monday.  I am concerned about him living in a car with the weather the way that it is.  He stated he might go to his sister's, but yesterday stated that his family members would not support him in terms of living in their home even temporarily.  I will have him contact his family today just in case.  Otherwise no changes in his medications at this point.  He does not appear to be in withdrawal from his opiates thank goodness.  1.  Continue Bentyl 20 mg p.o. every 6 hours as needed muscle and abdominals cramp. 2.  Continue hydroxyzine 25 mg p.o. every 6 hours as needed anxiety. 3.  Continue Imodium 2 to 4 mg p.o. as needed diarrhea. 4.  Continue Robaxin 500 mg p.o. every 8 hours as needed muscle spasms. 5.  Continue Naprosyn 500 mg p.o. twice daily as needed pain. 6.  Continue Zyprexa 5 mg p.o. twice daily for psychosis. 7.  Continue Zofran 4 mg p.o. every 8 hours as needed nausea. 8.  Disposition planning-in progress.  Antonieta Pert, MD 04/03/2019, 10:57 AM

## 2019-04-03 NOTE — Tx Team (Signed)
Interdisciplinary Treatment and Diagnostic Plan Update  04/03/2019 Time of Session: 09:40am Lucas Guerra MRN: [id number]  Principal Diagnosis: <principal problem not specified>  Secondary Diagnoses: Active Problems:   Schizophrenia (HCC)   Current Medications:  Current Facility-Administered Medications  Medication Dose Route Frequency Provider Last Rate Last Admin  . acetaminophen (TYLENOL) tablet 650 mg  650 mg Oral Q6H PRN Cristofano, Worthy Rancher, MD      . alum & mag hydroxide-simeth (MAALOX/MYLANTA) 200-200-20 MG/5ML suspension 30 mL  30 mL Oral Q4H PRN Cristofano, Paul A, MD      . dicyclomine (BENTYL) tablet 20 mg  20 mg Oral Q6H PRN Antonieta Pert, MD      . hydrOXYzine (ATARAX/VISTARIL) tablet 25 mg  25 mg Oral Q6H PRN Antonieta Pert, MD   25 mg at 04/03/19 0831  . loperamide (IMODIUM) capsule 2-4 mg  2-4 mg Oral PRN Antonieta Pert, MD      . magnesium hydroxide (MILK OF MAGNESIA) suspension 30 mL  30 mL Oral Daily PRN Cristofano, Worthy Rancher, MD      . methocarbamol (ROBAXIN) tablet 500 mg  500 mg Oral Q8H PRN Antonieta Pert, MD      . naproxen (NAPROSYN) tablet 500 mg  500 mg Oral BID PRN Antonieta Pert, MD      . OLANZapine [facility]) tablet 5 mg  5 mg Oral BID Cristofano, Worthy Rancher, MD   5 mg at 04/03/19 0831  . ondansetron (ZOFRAN-ODT) disintegrating tablet 4 mg  4 mg Oral Q6H PRN Antonieta Pert, MD       PTA Medications: Medications Prior to Admission  Medication Sig Dispense Refill Last Dose  . citalopram (CELEXA) 20 MG tablet TAKE 1 TABLET (20 MG TOTAL) BY MOUTH DAILY. (Patient not taking: Reported on 04/01/2019) 30 tablet 0   . dicyclomine (BENTYL) 20 MG tablet Take 1 tablet (20 mg total) by mouth 3 (three) times daily as needed for spasms. (Patient not taking: Reported on 04/01/2019) 20 tablet 1   . ibuprofen (ADVIL,MOTRIN) 200 MG tablet Take 200 mg by mouth every 6 (six) hours as needed for pain.     . promethazine (PHENERGAN) 25 MG tablet Take 1 tablet (25  mg total) by mouth every 4 (four) hours as needed for nausea or vomiting. (Patient not taking: Reported on 04/01/2019) 30 tablet 1     Patient Stressors: Medication change or noncompliance Substance abuse  Patient Strengths: Ability for insight Motivation for treatment/growth  Treatment Modalities: Medication Management, Group therapy, Case management,  1 to 1 session with clinician, Psychoeducation, Recreational therapy.   Physician Treatment Plan for Primary Diagnosis: <principal problem not specified> Long Term Goal(s): Improvement in symptoms so as ready for discharge Improvement in symptoms so as ready for discharge   Short Term Goals: Ability to identify changes in lifestyle to reduce recurrence of condition will improve Ability to verbalize feelings will improve Ability to demonstrate self-control will improve Ability to identify and develop effective coping behaviors will improve Ability to maintain clinical measurements within normal limits will improve Compliance with prescribed medications will improve Ability to identify triggers associated with substance abuse/mental health issues will improve Ability to identify changes in lifestyle to reduce recurrence of condition will improve Ability to verbalize feelings will improve Ability to demonstrate self-control will improve Ability to identify and develop effective coping behaviors will improve Ability to maintain clinical measurements within normal limits will improve Compliance with prescribed medications will improve Ability to identify triggers  associated with substance abuse/mental health issues will improve  Medication Management: Evaluate patient's response, side effects, and tolerance of medication regimen.  Therapeutic Interventions: 1 to 1 sessions, Unit Group sessions and Medication administration.  Evaluation of Outcomes: Progressing  Physician Treatment Plan for Secondary Diagnosis: Active Problems:    Schizophrenia ([place])  Long Term Goal(s): Improvement in symptoms so as ready for discharge Improvement in symptoms so as ready for discharge   Short Term Goals: Ability to identify changes in lifestyle to reduce recurrence of condition will improve Ability to verbalize feelings will improve Ability to demonstrate self-control will improve Ability to identify and develop effective coping behaviors will improve Ability to maintain clinical measurements within normal limits will improve Compliance with prescribed medications will improve Ability to identify triggers associated with substance abuse/mental health issues will improve Ability to identify changes in lifestyle to reduce recurrence of condition will improve Ability to verbalize feelings will improve Ability to demonstrate self-control will improve Ability to identify and develop effective coping behaviors will improve Ability to maintain clinical measurements within normal limits will improve Compliance with prescribed medications will improve Ability to identify triggers associated with substance abuse/mental health issues will improve     Medication Management: Evaluate patient's response, side effects, and tolerance of medication regimen.  Therapeutic Interventions: 1 to 1 sessions, Unit Group sessions and Medication administration.  Evaluation of Outcomes: Progressing   RN Treatment Plan for Primary Diagnosis: <principal problem not specified> Long Term Goal(s): Knowledge of disease and therapeutic regimen to maintain health will improve  Short Term Goals: Ability to participate in decision making will improve, Ability to verbalize feelings will improve, Ability to disclose and discuss suicidal ideas, Ability to identify and develop effective coping behaviors will improve and Compliance with prescribed medications will improve  Medication Management: RN will administer medications as ordered by provider, will assess and  evaluate patient's response and provide education to patient for prescribed medication. RN will report any adverse and/or side effects to prescribing provider.  Therapeutic Interventions: 1 on 1 counseling sessions, Psychoeducation, Medication administration, Evaluate responses to treatment, Monitor vital signs and CBGs as ordered, Perform/monitor CIWA, COWS, AIMS and Fall Risk screenings as ordered, Perform wound care treatments as ordered.  Evaluation of Outcomes: Progressing   LCSW Treatment Plan for Primary Diagnosis: <principal problem not specified> Long Term Goal(s): Safe transition to appropriate next level of care at discharge, Engage patient in therapeutic group addressing interpersonal concerns.  Short Term Goals: Engage patient in aftercare planning with referrals and resources and Increase skills for wellness and recovery  Therapeutic Interventions: Assess for all discharge needs, 1 to 1 time with Social worker, Explore available resources and support systems, Assess for adequacy in community support network, Educate family and significant other(s) on suicide prevention, Complete Psychosocial Assessment, Interpersonal group therapy.  Evaluation of Outcomes: Progressing   Progress in Treatment: Attending groups: No. Participating in groups: No. Taking medication as prescribed: Yes. Toleration medication: Yes. Family/Significant other contact made: No, will contact:  SPE with pt; pt declined Patient understands diagnosis: Yes. Discussing patient identified problems/goals with staff: Yes. Medical problems stabilized or resolved: Yes. Denies suicidal/homicidal ideation: Yes. Issues/concerns per patient self-inventory: No. Other:   New problem(s) identified: No, Describe:  None  New Short Term/Long Term Goal(s): Medication stabilization, elimination of SI thoughts, and development of a comprehensive mental wellness plan.   Patient Goals:  "go back to work"   Discharge Plan  or Barriers: Patient will be discharged back to living in his  car possibly on Monday (1/11) and will follow up with [facility].   Reason for Continuation of Hospitalization: Anxiety Depression Medication stabilization  Estimated Length of Stay: 1-2 days   Attendees: Patient: Lucas Guerra 04/03/2019   Physician: Dr. Landry Mellow, MD  04/03/2019   Nursing: Lorenda Hatchet, RN 04/03/2019   RN Care Manager: 04/03/2019   Social Worker: Stephannie Peters, LCSW  04/03/2019   Recreational Therapist:  04/03/2019   Other:  04/03/2019   Other:  04/03/2019   Other: 04/03/2019    Scribe for Treatment Team: Delphia Grates, LCSW 04/03/2019 11:03 AM

## 2019-04-03 NOTE — Plan of Care (Signed)
Patient oriented to unit. Patient's safety is maintained on unit. Patient denies SI/HI/AVH. Adherent with scheduled medications. Discharged focused.    Problem: Education: Goal: Knowledge of [facility] Education information/materials will improve Outcome: Progressing Goal: Emotional status will improve Outcome: Progressing Goal: Mental status will improve Outcome: Progressing

## 2019-04-04 MED ORDER — OLANZAPINE 5 MG PO TABS: 5.0000 mg | ORAL_TABLET | Freq: Two times a day (BID) | ORAL | 0 refills | Status: DC

## 2019-04-04 MED ORDER — OLANZAPINE 5 MG PO TABS: 5.0000 mg | ORAL_TABLET | Freq: Two times a day (BID) | ORAL | Status: DC

## 2019-04-04 NOTE — Progress Notes (Signed)
Patient ID: Lucas Guerra, male   DOB: Jan 03, 1986, 34 y.o.   MRN: [id number]   Discharge Note:  Patient denies SI/HI/AVH at this time. Discharge instructions, AVS, prescriptions, and transition record gone over with patient. Patient agrees to comply with medication management, follow-up visit, and outpatient therapy. Patient belongings returned to patient. Patient questions and concerns addressed and answered. Patient ambulatory off unit. Patient discharged to parking lot to his vehicle.

## 2019-04-04 NOTE — Discharge Summary (Signed)
Physician Discharge Summary Note  Patient:  Lucas Guerra is an 34 y.o., male MRN:  [id number] DOB:  Mar 27, 1985 Patient phone:  [phone] (home)  Patient address:   [address] [place] [address],  Total Time spent with patient: 30 minutes  Date of Admission:  04/01/2019 Date of Discharge: 04/04/2019  Reason for Admission: Psychosis  Principal Problem: <principal problem not specified> Discharge Diagnoses: Active Problems:   Schizophrenia [facility])   Past Psychiatric History: Patient has a past psychiatric history significant for schizophrenia and substance abuse.  There notes in the electronic medical record that revealed that he has carried a diagnosis of schizophrenia since approximately 2017.  He also has been previously diagnosed with major depression as well as substance abuse issues.  Past Medical History:  Past Medical History:  Diagnosis Date  . Anxiety   . GERD (gastroesophageal reflux disease)   . Heart murmur   . Plantar wart of right foot   . Schizophrenia [facility])     Past Surgical History:  Procedure Laterality Date  . ESOPHAGOGASTRODUODENOSCOPY    . HARDWARE REMOVAL Left 06/04/2016   Procedure: HARDWARE REMOVAL WITH POSSIBLE BONE GRAFTING OF THE DISTAL FIBULAR FRACTURE;  Surgeon: Christena Flake, MD;  Location: [facility] ORS;  Service: Orthopedics;  Laterality: Left;  . ORIF ANKLE FRACTURE Right 08/19/15  . ORIF ANKLE FRACTURE Left 08/19/2015   Procedure: OPEN REDUCTION INTERNAL FIXATION (ORIF) ANKLE FRACTURE;  Surgeon: Christena Flake, MD;  Location: [facility] ORS;  Service: Orthopedics;  Laterality: Left;  . PLANTAR'S WART EXCISION Right    Family History:  Family History  Problem Relation Age of Onset  . Hypertension Mother   . Diabetes Father    Family Psychiatric  History: Denied Social History:  Social History   Substance and Sexual Activity  Alcohol Use No  . Alcohol/week: 0.0 standard drinks   Comment: rare consumption     Social History   Substance  and Sexual Activity  Drug Use No    Social History   Socioeconomic History  . Marital status: Single    Spouse name: Not on file  . Number of children: Not on file  . Years of education: Not on file  . Highest education level: Not on file  Occupational History  . Not on file  Tobacco Use  . Smoking status: Current Every Day Smoker    Packs/day: 2.00    Years: 12.00    Pack years: 24.00    Types: Cigarettes  . Smokeless tobacco: Never Used  Substance and Sexual Activity  . Alcohol use: No    Alcohol/week: 0.0 standard drinks    Comment: rare consumption  . Drug use: No  . Sexual activity: Not on file  Other Topics Concern  . Not on file  Social History Narrative  . Not on file   Social Determinants of Health   Financial Resource Strain:   . Difficulty of Paying Living Expenses: Not on file  Food Insecurity:   . Worried About Programme researcher, broadcasting/film/video in the Last Year: Not on file  . Ran Out of Food in the Last Year: Not on file  Transportation Needs:   . Lack of Transportation (Medical): Not on file  . Lack of Transportation (Non-Medical): Not on file  Physical Activity:   . Days of Exercise per Week: Not on file  . Minutes of Exercise per Session: Not on file  Stress:   . Feeling of Stress : Not on file  Social Connections:   .  Frequency of Communication with Friends and Family: Not on file  . Frequency of Social Gatherings with Friends and Family: Not on file  . Attends Religious Services: Not on file  . Active Member of Clubs or Organizations: Not on file  . Attends Archivist Meetings: Not on file  . Marital Status: Not on file    Hospital Course: Patient is a 34 year old male with the above-stated past psychiatric history who originally presented to the [facility] emergency department on 04/01/2019 with self-reported hallucinations and panic attacks.  The patient had apparently been in jail for approximately a year, and at least been  off his medications for somewhere between 7 months.  In the emergency room he became agitated and began to act bizarrely.  He had to receive Haldol, Benadryl and Ativan.  His drug screen on admission revealed amphetamines as well as opiates.  He was admitted to the hospital for evaluation and stabilization.  When I first saw the patient on 04/02/2019 he was able to be aroused easily.  He denied any auditory or visual hallucinations.  He denied any suicidal ideation.  He stated that his main objective of coming in the hospital was to get back on his Zyprexa.  He was monitored for 3 days on Zyprexa to 5 mg p.o. twice daily.  He did well throughout the course of the hospitalization.  He showed no evidence of active psychosis or withdrawal.  On 04/03/2018 he was doing well, not homicidal, not suicidal, not psychotic.  It was decided he could be discharged home.  Physical Findings: AIMS:  , ,  ,  ,    CIWA:    COWS:  COWS Total Score: 1  Musculoskeletal: Strength & Muscle Tone: within normal limits Gait & Station: normal Patient leans: N/A  Psychiatric Specialty Exam: Physical Exam  Nursing note and vitals reviewed. Constitutional: He is oriented to person, place, and time. He appears well-developed and well-nourished.  HENT:  Head: Normocephalic and atraumatic.  Respiratory: Effort normal.  Neurological: He is alert and oriented to person, place, and time.    Review of Systems  Blood pressure 115/80, pulse 81, temperature 98.4 F (36.9 C), temperature source Oral, resp. rate 17, height 5\' 6"  (1.676 m), weight 54.4 kg, SpO2 100 %.Body mass index is 19.36 kg/m.  General Appearance: Casual  Eye Contact:  Good  Speech:  Normal Rate  Volume:  Normal  Mood:  Euthymic  Affect:  Congruent  Thought Process:  Coherent and Descriptions of Associations: Intact  Orientation:  Full (Time, Place, and Person)  Thought Content:  Logical  Suicidal Thoughts:  No  Homicidal Thoughts:  No  Memory:   Immediate;   Fair Recent;   Fair Remote;   Fair  Judgement:  Intact  Insight:  Fair  Psychomotor Activity:  Normal  Concentration:  Concentration: Fair and Attention Span: Fair  Recall:  [employer] of Knowledge:  Fair  Language:  Good  Akathisia:  Negative  Handed:  Right  AIMS (if indicated):     Assets:  Communication Skills Desire for Improvement Financial Resources/Insurance Resilience  ADL's:  Intact  Cognition:  WNL  Sleep:  Number of Hours: 8     Have you used any form of tobacco in the last 30 days? (Cigarettes, Smokeless Tobacco, Cigars, and/or Pipes): No  Has this patient used any form of tobacco in the last 30 days? (Cigarettes, Smokeless Tobacco, Cigars, and/or Pipes) Yes, No  Blood Alcohol level:  Lab Results  Component Value Date   ETH <10 04/01/2019   ETH <5 05/06/2016    Metabolic Disorder Labs:  No results found for: HGBA1C, MPG No results found for: PROLACTIN Lab Results  Component Value Date   CHOL 157 04/02/2019   TRIG 108 04/02/2019   HDL 44 04/02/2019   CHOLHDL 3.6 04/02/2019   VLDL 22 04/02/2019   LDLCALC 91 04/02/2019    See Psychiatric Specialty Exam and Suicide Risk Assessment completed by Attending Physician prior to discharge.  Discharge destination:  Home  Is patient on multiple antipsychotic therapies at discharge:  No   Has Patient had three or more failed trials of antipsychotic monotherapy by history:  No  Recommended Plan for Multiple Antipsychotic Therapies: NA  Discharge Instructions    Diet - low sodium heart healthy   Complete by: As directed    Increase activity slowly   Complete by: As directed      Allergies as of 04/04/2019      Reactions   Tramadol Rash      Medication List    STOP taking these medications   citalopram 20 MG tablet Commonly known as: CELEXA   dicyclomine 20 MG tablet Commonly known as: Bentyl   ibuprofen 200 MG tablet Commonly known as: ADVIL   promethazine 25 MG tablet Commonly  known as: PHENERGAN     TAKE these medications     Indication  OLANZapine 5 MG tablet Commonly known as: ZYPREXA Take 1 tablet (5 mg total) by mouth 2 (two) times daily.  Indication: Schizophrenia   OLANZapine 5 MG tablet Commonly known as: ZYPREXA Take 1 tablet (5 mg total) by mouth 2 (two) times daily.  Indication: Schizophrenia      Follow-up Information    [facility] Follow up.   Why: You are scheduled for a zoom appointment with Unk Pinto on Wednesday, January 13th at 7am. Thank you. Contact information: [address] [place] [address] [phone]           Follow-up recommendations:  Activity:  ad lib  Comments: Do not use substances, drugs or alcohol.  Signed: Antonieta Pert, MD 04/04/2019, 11:46 AM

## 2019-04-04 NOTE — [facility] Suicide Risk Assessment (Signed)
[facility] Discharge Suicide Risk Assessment   Principal Problem: <principal problem not specified> Discharge Diagnoses: Active Problems:   Schizophrenia (HCC)   Total Time spent with patient: 20 minutes  Musculoskeletal: Strength & Muscle Tone: within normal limits Gait & Station: normal Patient leans: N/A  Psychiatric Specialty Exam: Review of Systems  All other systems reviewed and are negative.   Blood pressure 115/80, pulse 81, temperature 98.4 F (36.9 C), temperature source Oral, resp. rate 17, height 5\' 6"  (1.676 m), weight 54.4 kg, SpO2 100 %.Body mass index is 19.36 kg/m.  General Appearance: Casual  Eye Contact::  Good  Speech:  Normal Rate409  Volume:  Normal  Mood:  Euthymic  Affect:  Congruent  Thought Process:  Coherent and Descriptions of Associations: Intact  Orientation:  Full (Time, Place, and Person)  Thought Content:  Logical  Suicidal Thoughts:  No  Homicidal Thoughts:  No  Memory:  Immediate;   Fair Recent;   Fair Remote;   Fair  Judgement:  Intact  Insight:  Fair  Psychomotor Activity:  Normal  Concentration:  Good  Recall:  Fair  Fund of Knowledge:Fair  Language: Good  Akathisia:  Negative  Handed:  Right  AIMS (if indicated):     Assets:  Communication Skills Desire for Improvement Resilience  Sleep:  Number of Hours: 8  Cognition: WNL  ADL's:  Intact   Mental Status Per Nursing Assessment::   On Admission:  NA  Demographic Factors:  Male, Divorced or widowed, Low socioeconomic status and Living alone  Loss Factors: Financial problems/change in socioeconomic status  Historical Factors: Impulsivity  Risk Reduction Factors:   Positive coping skills or problem solving skills  Continued Clinical Symptoms:  Schizophrenia:   Less than 21 years old Paranoid or undifferentiated type  Cognitive Features That Contribute To Risk:  None    Suicide Risk:  Minimal: No identifiable suicidal ideation.  Patients presenting with no risk  factors but with morbid ruminations; may be classified as minimal risk based on the severity of the depressive symptoms  Follow-up Information    [facility] Follow up.   Why: You are scheduled for a zoom appointment with 41 on Wednesday, January 13th at 7am. Thank you. Contact information: [address] [place] [phone]           Plan Of Care/Follow-up recommendations:  Activity:  ad lib  [phone], MD 04/04/2019, 9:27 AM

## 2019-04-04 NOTE — Progress Notes (Signed)
D- Patient alert and oriented. Patient presents in a pleasant mood on assessment stating that he slept "pretty good" last night and had no complaints to voice to this Clinical research associate. Patient denies depression/anxiety to this writer, however, he reported an anxiety level of "3/10", on his self-inventory. Patient also denies SI, HI, AVH, and pain at this time. Patient's goal for today is "medication", in which he will "talk to the doctor", in order to accomplish his goal.   A- Scheduled medications administered to patient, per MD orders. Support and encouragement provided.  Routine safety checks conducted every 15 minutes.  Patient informed to notify staff with problems or concerns.  R- No adverse drug reactions noted. Patient contracts for safety at this time. Patient compliant with medications and treatment plan. Patient receptive, calm, and cooperative. Patient interacts well with others on the unit.  Patient remains safe at this time.

## 2019-04-04 NOTE — Progress Notes (Signed)
  [facility] Adult Case Management Discharge Plan :  Will you be returning to the same living situation after discharge:  Yes,  returning home. At discharge, do you have transportation home?: Yes,  car is here. Do you have the ability to pay for your medications: Yes,  Medicare.   Patient to Follow up at: Follow-up Information    [facility] Follow up.   Why: You are scheduled for a zoom appointment with Unk Pinto on Wednesday, January 13th at 7am. Thank you. Contact information: [address] [place] [address] [phone]           Next level of care provider has access to [facility] Link:no  Safety Planning and Suicide Prevention discussed: Yes,  completed with patient.  Have you used any form of tobacco in the last 30 days? (Cigarettes, Smokeless Tobacco, Cigars, and/or Pipes): No  Has patient been referred to the Quitline?: N/A patient is not a smoker  Patient has been referred for addiction treatment: Yes  Darreld Mclean, LCSWA 04/04/2019, 10:20 AM

## 2020-05-05 ENCOUNTER — Ambulatory Visit: Payer: Medicaid Other | Admitting: Internal Medicine

## 2021-04-23 ENCOUNTER — Ambulatory Visit: Payer: Self-pay

## 2021-04-23 NOTE — Telephone Encounter (Signed)
Appt scheduled and added to the waitlist in case an appt comes open

## 2021-04-23 NOTE — Telephone Encounter (Signed)
° °  Chief Complaint: Lower abdominal pain Symptoms: Pain, vomiting, pain with bowel movements Frequency: Started 2 months ago Pertinent Negatives: Patient denies fever Disposition: [x] ED /[] Urgent Care (no appt availability in office) / [] Appointment(In office/virtual)/ []  [facility]/ [] Home Care/ [] Refused Recommended Disposition /[] [place]/ []  Follow-up with PCP Additional Notes: Pt. Asking to establish care. Soonest appointment in June. Asking to be seen sooner than June. Please advise. Reason for Disposition  Patient sounds very sick or weak to the triager  Answer Assessment - Initial Assessment Questions 1. LOCATION: "Where does it hurt?"      Under belly button 2. RADIATION: "Does the pain shoot anywhere else?" (e.g., chest, back)     No 3. ONSET: "When did the pain begin?" (Minutes, hours or days ago)      2 MONTHS 4. SUDDEN: "Gradual or sudden onset?"     Gradual, getting worse 5. PATTERN "Does the pain come and go, or is it constant?"    - If constant: "Is it getting better, staying the same, or worsening?"      (Note: Constant means the pain never goes away completely; most serious pain is constant and it progresses)     - If intermittent: "How long does it last?" "Do you have pain now?"     (Note: Intermittent means the pain goes away completely between bouts)     Comes and goes 6. SEVERITY: "How bad is the pain?"  (e.g., Scale 1-10; mild, moderate, or severe)    - MILD (1-3): doesn't interfere with normal activities, abdomen soft and not tender to touch     - MODERATE (4-7): interferes with normal activities or awakens from sleep, abdomen tender to touch     - SEVERE (8-10): excruciating pain, doubled over, unable to do any normal activities       8 7. RECURRENT SYMPTOM: "Have you ever had this type of stomach pain before?" If Yes, ask: "When was the last time?" and "What happened that time?"      No 8. CAUSE: "What do you think is causing the  stomach pain?"     Unsure 9. RELIEVING/AGGRAVATING FACTORS: "What makes it better or worse?" (e.g., movement, antacids, bowel movement)     Bowel movements hurt 10. OTHER SYMPTOMS: "Do you have any other symptoms?" (e.g., back pain, diarrhea, fever, urination pain, vomiting)       Hurt  Protocols used: Abdominal Pain - Male-A-AH

## 2021-05-09 ENCOUNTER — Emergency Department (HOSPITAL_COMMUNITY): Admission: EM | Admit: 2021-05-09 | Discharge: 2021-05-09 | Discharge status: Against Medical Advice | Payer: Self-pay

## 2021-05-09 NOTE — ED Notes (Signed)
Patient left on own accord °

## 2021-09-05 ENCOUNTER — Ambulatory Visit: Payer: Self-pay | Admitting: Nurse Practitioner

## 2021-09-05 NOTE — Progress Notes (Deleted)
There were no vitals taken for this visit.   Subjective:    Patient ID: Lucas Guerra, male    DOB: 06-Aug-1985, 36 y.o.   MRN: [id number]  HPI: Lucas Guerra is a 36 y.o. male  No chief complaint on file.  Patient presents to clinic to establish care with new PCP.  Introduced to Publishing rights manager role and practice setting.  All questions answered.  Discussed provider/patient relationship and expectations.  Patient reports a history of ***. Patient denies a history of: Hypertension, Elevated Cholesterol, Diabetes, Thyroid problems, Depression, Anxiety, Neurological problems, and Abdominal problems.   Active Ambulatory Problems    Diagnosis Date Noted   Alphaherpesviral disease 11/02/2014   Paranoid schizophrenia (HCC) 11/02/2014   Acid reflux 11/02/2014   Dysphagia 11/02/2014   Disorder of mitral valve 11/02/2014   Status post ORIF of fracture of ankle 06/10/2016   Tobacco use disorder 05/05/2017   Schizophrenia (HCC) 04/01/2019   Resolved Ambulatory Problems    Diagnosis Date Noted   Chronic peptic ulcer without hemorrhage or perforation 11/02/2014   Back ache 11/02/2014   BP (high blood pressure) 11/02/2014   Closed displaced trimalleolar fracture of left lower leg 08/22/2015   N&V (nausea and vomiting) 06/14/2015   Past Medical History:  Diagnosis Date   Anxiety    GERD (gastroesophageal reflux disease)    Heart murmur    Plantar wart of right foot    Past Surgical History:  Procedure Laterality Date   ESOPHAGOGASTRODUODENOSCOPY     HARDWARE REMOVAL Left 06/04/2016   Procedure: HARDWARE REMOVAL WITH POSSIBLE BONE GRAFTING OF THE DISTAL FIBULAR FRACTURE;  Surgeon: Christena Flake, MD;  Location: [facility] OR[facility];  Service: Orthopedics;  Laterality: Left;   ORIF ANKLE FRACTURE Right 08/19/15   ORIF ANKLE FRACTURE Left 08/19/2015   Procedure: OPEN REDUCTION INTERNAL FIXATION (ORIF) ANKLE FRACTURE;  Surgeon: Christena Flake, MD;  Location: [facility] OR[facility];  Service: Orthopedics;   Laterality: Left;   PLANTAR'S WART EXCISION Right    Family History  Problem Relation Age of Onset   Hypertension Mother    Diabetes Father      Review of Systems  Per HPI unless specifically indicated above     Objective:    There were no vitals taken for this visit.  Wt Readings from Last 3 Encounters:  04/01/19 120 lb (54.4 kg)  01/12/19 130 lb (59 kg)  05/05/17 143 lb (64.9 kg)    Physical Exam  Results for orders placed or performed during the hospital encounter of 04/01/19  Respiratory Panel by RT PCR (Flu A&B, Covid) - Nasopharyngeal Swab   Specimen: Nasopharyngeal Swab  Result Value Ref Range   SARS Coronavirus 2 by RT PCR NEGATIVE NEGATIVE   Influenza A by PCR NEGATIVE NEGATIVE   Influenza B by PCR NEGATIVE NEGATIVE  Comprehensive metabolic panel  Result Value Ref Range   Sodium 137 135 - 145 mmol/L   Potassium 3.1 (L) 3.5 - 5.1 mmol/L   Chloride 95 (L) 98 - 111 mmol/L   CO2 33 (H) 22 - 32 mmol/L   Glucose, Bld 82 70 - 99 mg/dL   BUN 19 6 - 20 mg/dL   Creatinine, Ser 5.24 0.61 - 1.24 mg/dL   Calcium 9.4 8.9 - 81.8 mg/dL   Total Protein 7.1 6.5 - 8.1 g/dL   Albumin 4.0 3.5 - 5.0 g/dL   AST 16 15 - 41 U/L   ALT 13 0 - 44 U/L   Alkaline Phosphatase 69  38 - 126 U/L   Total Bilirubin 0.6 0.3 - 1.2 mg/dL   GFR calc non Af Amer >60 >60 mL/min   GFR calc Af Amer >60 >60 mL/min   Anion gap 9 5 - 15  Ethanol  Result Value Ref Range   Alcohol, Ethyl (B) <10 <10 mg/dL  Urine Drug Screen, Qualitative  Result Value Ref Range   Tricyclic, Ur Screen NONE DETECTED NONE DETECTED   Amphetamines, Ur Screen POSITIVE (A) NONE DETECTED   MDMA (Ecstasy)Ur Screen NONE DETECTED NONE DETECTED   Cocaine Metabolite,Ur [place] NONE DETECTED NONE DETECTED   Opiate, Ur Screen POSITIVE (A) NONE DETECTED   Phencyclidine (PCP) Ur S NONE DETECTED NONE DETECTED   Cannabinoid 50 Ng, Ur [place] NONE DETECTED NONE DETECTED   Barbiturates, Ur Screen NONE DETECTED NONE DETECTED   Benzodiazepine,  Ur Scrn NONE DETECTED NONE DETECTED   Methadone Scn, Ur NONE DETECTED NONE DETECTED  CBC with Diff  Result Value Ref Range   WBC 6.4 4.0 - 10.5 K/uL   RBC 4.69 4.22 - 5.81 MIL/uL   Hemoglobin 13.5 13.0 - 17.0 g/dL   HCT 49.6 75.9 - 16.3 %   MCV 85.9 80.0 - 100.0 fL   MCH 28.8 26.0 - 34.0 pg   MCHC 33.5 30.0 - 36.0 g/dL   RDW 84.6 65.9 - 93.5 %   Platelets 217 150 - 400 K/uL   nRBC 0.0 0.0 - 0.2 %   Neutrophils Relative % 37 %   Neutro Abs 2.5 1.7 - 7.7 K/uL   Lymphocytes Relative 53 %   Lymphs Abs 3.5 0.7 - 4.0 K/uL   Monocytes Relative 6 %   Monocytes Absolute 0.4 0.1 - 1.0 K/uL   Eosinophils Relative 3 %   Eosinophils Absolute 0.2 0.0 - 0.5 K/uL   Basophils Relative 1 %   Basophils Absolute 0.0 0.0 - 0.1 K/uL   WBC Morphology MORPHOLOGY UNREMARKABLE    RBC Morphology MORPHOLOGY UNREMARKABLE    Smear Review Normal platelet morphology    Immature Granulocytes 0 %   Abs Immature Granulocytes 0.01 0.00 - 0.07 K/uL  Troponin I (High Sensitivity)  Result Value Ref Range   Troponin I (High Sensitivity) <2 <18 ng/L      Assessment & Plan:   Problem List Items Addressed This Visit   None    Follow up plan: No follow-ups on file.

## 2021-12-26 ENCOUNTER — Emergency Department
Admission: EM | Admit: 2021-12-26 | Discharge: 2021-12-26 | Disposition: A | Discharge status: Home / Self Care | Payer: Medicaid Other | Attending: Emergency Medicine | Admitting: Emergency Medicine

## 2021-12-26 ENCOUNTER — Other Ambulatory Visit: Payer: Self-pay

## 2021-12-26 ENCOUNTER — Emergency Department: Payer: Medicaid Other

## 2021-12-26 ENCOUNTER — Encounter: Payer: Self-pay | Admitting: Emergency Medicine

## 2021-12-26 DIAGNOSIS — R112 Nausea with vomiting, unspecified: Secondary | ICD-10-CM

## 2021-12-26 DIAGNOSIS — R1110 Vomiting, unspecified: Secondary | ICD-10-CM | POA: Insufficient documentation

## 2021-12-26 DIAGNOSIS — R079 Chest pain, unspecified: Secondary | ICD-10-CM | POA: Insufficient documentation

## 2021-12-26 LAB — TROPONIN I (HIGH SENSITIVITY): Troponin I (High Sensitivity): 4 ng/L (ref <18)

## 2021-12-26 LAB — BASIC METABOLIC PANEL
Anion gap: 10 (ref 5–15)
BUN: 21 mg/dL — ABNORMAL HIGH (ref 6–20)
CO2: 28 mmol/L (ref 22–32)
Calcium: 9.4 mg/dL (ref 8.9–10.3)
Chloride: 103 mmol/L (ref 98–111)
Creatinine, Ser: 1.43 mg/dL — ABNORMAL HIGH (ref 0.61–1.24)
GFR, Estimated: >60 mL/min (ref >60)
Glucose, Bld: 125 mg/dL — ABNORMAL HIGH (ref 70–99)
Potassium: 3.3 mmol/L — ABNORMAL LOW (ref 3.5–5.1)
Sodium: 141 mmol/L (ref 135–145)

## 2021-12-26 LAB — CBC
HCT: 43.4 % (ref 39.0–52.0)
Hemoglobin: 14.1 g/dL (ref 13.0–17.0)
MCH: 28 pg (ref 26.0–34.0)
MCHC: 32.5 g/dL (ref 30.0–36.0)
MCV: 86.3 fL (ref 80.0–100.0)
Platelets: 299 10*3/uL (ref 150–400)
RBC: 5.03 MIL/uL (ref 4.22–5.81)
RDW: 12.5 % (ref 11.5–15.5)
WBC: 8.1 10*3/uL (ref 4.0–10.5)
nRBC: 0 % (ref 0.0–0.2)

## 2021-12-26 MED ORDER — PANTOPRAZOLE SODIUM 40 MG IV SOLR: 40.0000 mg | Freq: Once | INTRAVENOUS | Status: DC

## 2021-12-26 MED ORDER — LACTATED RINGERS IV BOLUS
1000.0000 mL | Freq: Once | INTRAVENOUS | Status: AC
  Administered 2021-12-26: 1000 mL via INTRAVENOUS

## 2021-12-26 MED ORDER — FAMOTIDINE IN NACL 20-0.9 MG/50ML-% IV SOLN
20.0000 mg | Freq: Once | INTRAVENOUS | Status: AC
  Administered 2021-12-26: 20 mg via INTRAVENOUS
  Filled 2021-12-26: qty 50

## 2021-12-26 MED ORDER — NALOXONE HCL 4 MG/0.1ML NA LIQD
1.0000 | Freq: Once | NASAL | Status: AC
  Administered 2021-12-26: 1 via NASAL
  Filled 2021-12-26: qty 4

## 2021-12-26 MED ORDER — DIPHENHYDRAMINE HCL 50 MG/ML IJ SOLN
25.0000 mg | Freq: Once | INTRAMUSCULAR | Status: AC
  Administered 2021-12-26: 25 mg via INTRAVENOUS
  Filled 2021-12-26: qty 1

## 2021-12-26 MED ORDER — ONDANSETRON 4 MG PO TBDP: 4.0000 mg | ORAL_TABLET | Freq: Three times a day (TID) | ORAL | 0 refills | Status: AC | PRN

## 2021-12-26 MED ORDER — DROPERIDOL 2.5 MG/ML IJ SOLN
2.5000 mg | Freq: Once | INTRAMUSCULAR | Status: AC
  Administered 2021-12-26: 2.5 mg via INTRAVENOUS
  Filled 2021-12-26: qty 2

## 2021-12-26 NOTE — ED Provider Notes (Addendum)
[facility] Provider Note    Event Date/Time   First MD Initiated Contact with Patient 12/26/21 [phone]     (approximate)   History   Chest Pain   HPI  Lucas Guerra is a 36 y.o. male past medical history of schizophrenia, GERD, anxiety who presents with vomiting.  Patient tells me he was eating chicken soup and then shortly after felt like he got something stuck in his distal esophagus and has not been able to tolerate p.o. since then.  When he tries to drink shortly after he vomits.  Feels like he also cannot belch.  Patient tells me that he has a long history of cyclic vomiting and tells me he basically vomits everything he eats and drinks this has been going on for years tells me he has had multiple endoscopies in the past and they never found anything.  Only record I can see is that he has had a normal barium swallow.  Denies any marijuana use. Denies pain currently. Does not have GI f/u currently, tells me he is trying to get a referral from primary care to a different GI specialist.    Past Medical History:  Diagnosis Date   Anxiety    GERD (gastroesophageal reflux disease)    Heart murmur    Plantar wart of right foot    Schizophrenia [facility])     Patient Active Problem List   Diagnosis Date Noted   Schizophrenia ([place]) 04/01/2019   Tobacco use disorder 05/05/2017   Status post ORIF of fracture of ankle 06/10/2016   Alphaherpesviral disease 11/02/2014   Paranoid schizophrenia ([place]) 11/02/2014   Acid reflux 11/02/2014   Dysphagia 11/02/2014   Disorder of mitral valve 11/02/2014     Physical Exam  Triage Vital Signs: ED Triage Vitals  Enc Vitals Group     BP 12/26/21 0329 (!) 128/92     Pulse Rate 12/26/21 0329 (!) 118     Resp 12/26/21 0329 18     Temp 12/26/21 0329 98 F (36.7 C)     Temp Source 12/26/21 0329 Oral     SpO2 12/26/21 0329 95 %     Weight 12/26/21 0327 135 lb (61.2 kg)     Height 12/26/21 0327 5\' 6"  (1.676 m)     Head  Circumference --      Peak Flow --      Pain Score 12/26/21 0327 10     Pain Loc --      Pain Edu? --      Excl. in [place]? --     Most recent vital signs: Vitals:   12/26/21 0329  BP: (!) 128/92  Pulse: (!) 118  Resp: 18  Temp: 98 F (36.7 C)  SpO2: 95%     General: Awake, no distress.  CV:  Good peripheral perfusion.  Resp:  Normal effort.  Abd:  No distention.  Abdomen is soft nontender Neuro:             Awake, Alert, Oriented x 3  Other:     ED Results / Procedures / Treatments  Labs (all labs ordered are listed, but only abnormal results are displayed) Labs Reviewed  BASIC METABOLIC PANEL - Abnormal; Notable for the following components:      Result Value   Potassium 3.3 (*)    Glucose, Bld 125 (*)    BUN 21 (*)    Creatinine, Ser 1.43 (*)    All other components within normal  limits  CBC  TROPONIN I (HIGH SENSITIVITY)     EKG  EKG interpretation performed by myself: NSR, nml axis,  short pr interval, no acute ischemic changes    RADIOLOGY I reviewed and interpreted the CXR which does not show any acute cardiopulmonary process    PROCEDURES:  Critical Care performed: No  Procedures   MEDICATIONS ORDERED IN ED: Medications  diphenhydrAMINE (BENADRYL) injection 25 mg (has no administration in time range)  lactated ringers bolus 1,000 mL (1,000 mLs Intravenous New Bag/Given 12/26/21 0450)  droperidol (INAPSINE) 2.5 MG/ML injection 2.5 mg (2.5 mg Intravenous Given 12/26/21 0443)  famotidine (PEPCID) IVPB 20 mg premix (0 mg Intravenous Stopped 12/26/21 0513)     IMPRESSION / MDM / ASSESSMENT AND PLAN / ED COURSE  I reviewed the triage vital signs and the nursing notes.                              Patient's presentation is most consistent with acute complicated illness / injury requiring diagnostic workup.  Differential diagnosis includes, but is not limited to, cyclic vomiting, gastroparesis, achalasia, esophageal obstruction, esophageal  spasm  Patient is a 36 year old male with history of cyclic vomiting presents because he feels like there is something stuck in his esophagus.  Started after he was eating chicken soup but denies any specific moment when he felt something get lodged.  He points to his xiphoid as the site where he feels something is stuck.  Tells me he has not really been able to tolerate p.o. since.  Larita Fife tells me he has a long history of clear vomiting and essentially vomits every day everything he eats or drinks.  Has seen GI in the past per report and has had endoscopies which apparently are unrevealing although I am not able to see any record of this.  Patient looks well is not actively vomiting currently his abdomen is soft nontender.  Labs show mild hypokalemia and creatinine slightly increased from baseline.  Troponin is negative chest x-ray is normal EKG is nonischemic.  I have low suspicion for food bolus given patient was just eating chicken soup prior to developing the sensation.  I suspect more that this is an exacerbation of his cyclic vomiting and perhaps he has some esophagitis could be causing sensation that there is something stuck.  Will give droperidol LR and Pepcid to see if we can get him to a point where he can tolerate p.o.  Ultimately he will need GI follow-up.  Patient given droperidol LR and Pepcid.  He was sleeping and then suddenly became anxious asking to leave.  Suspect that he is having akathisia reaction from the droperidol.  Discussed the likely side effect with him recommended we give some Benadryl to see if he can make him more comfortable but he was adamant that he wanted the IV taken out and wanted to leave.  He understood risks of ongoing nausea vomiting and him continuing to feel symptomatic upon discharge and had capacity to leave.   Patient's partner pulled the nurses side and said that he has been having issues with opiate use disorder and withdrawal.  They tried to go to an outpatient  treatment facility but he was denied.  Patient does admit to using fentanyl every day last was several hours ago and thinks he may be him some withdrawal currently.  He is interested in Suboxone however he is not yet in enough withdrawal that  it is safe to give it to him as it would likely precipitate more withdrawal.  Discussed following up with [facility] and will provide resources.  FINAL CLINICAL IMPRESSION(S) / ED DIAGNOSES   Final diagnoses:  Nausea and vomiting, unspecified vomiting type     Rx / DC Orders   ED Discharge Orders          Ordered    ondansetron (ZOFRAN-ODT) 4 MG disintegrating tablet  Every 8 hours PRN        12/26/21 0524             Note:  This document was prepared using Dragon voice recognition software and may include unintentional dictation errors.   Georga Hacking, MD 12/26/21 [id number]    Georga Hacking, MD 12/26/21 [phone]

## 2021-12-26 NOTE — ED Notes (Signed)
Pt requesting to use bathroom and provided with urinal in room while staff and spouse step out. Once finished, Probation officer recognizes that pt has snorted substance and pt denies at first but admits to using Fentanyl in room. MD made aware and spouse voices concern with tears present.

## 2021-12-26 NOTE — Discharge Instructions (Addendum)
Please follow-up with a gastroenterologist.  I have prescribed you Zofran for nausea and vomiting.

## 2021-12-26 NOTE — ED Notes (Signed)
Pt admits to using Fentanyl prior to arrival to ED and is anxious that the medication given here are making him feel worse. Pts spouse reports that pt was taken to Rehab on 10/3 but once there was told that he could not smoke tobacco and left. Pt and spouse requesting resources for local treatment options.

## 2021-12-26 NOTE — ED Triage Notes (Signed)
Patient ambulatory to triage with steady gait, without difficulty or distress noted; pt reports discomfort to left side chest this morning accomp by "acid reflux" st "feels like something stuck"

## 2022-01-01 NOTE — Progress Notes (Unsigned)
There were no vitals taken for this visit.   Subjective:    Patient ID: Lucas Guerra, male    DOB: March 07, 1986, 36 y.o.   MRN: [id number]  HPI: Lucas Guerra is a 36 y.o. male  No chief complaint on file.  Patient presents to clinic to establish care with new PCP.  Introduced to Publishing rights manager role and practice setting.  All questions answered.  Discussed provider/patient relationship and expectations.  Patient reports a history of ***. Patient denies a history of: Hypertension, Elevated Cholesterol, Diabetes, Thyroid problems, Depression, Anxiety, Neurological problems, and Abdominal problems.   Active Ambulatory Problems    Diagnosis Date Noted   Alphaherpesviral disease 11/02/2014   Paranoid schizophrenia (HCC) 11/02/2014   Acid reflux 11/02/2014   Dysphagia 11/02/2014   Disorder of mitral valve 11/02/2014   Status post ORIF of fracture of ankle 06/10/2016   Tobacco use disorder 05/05/2017   Schizophrenia (HCC) 04/01/2019   Resolved Ambulatory Problems    Diagnosis Date Noted   Chronic peptic ulcer without hemorrhage or perforation 11/02/2014   Back ache 11/02/2014   BP (high blood pressure) 11/02/2014   Closed displaced trimalleolar fracture of left lower leg 08/22/2015   N&V (nausea and vomiting) 06/14/2015   Past Medical History:  Diagnosis Date   Anxiety    GERD (gastroesophageal reflux disease)    Heart murmur    Plantar wart of right foot    Past Surgical History:  Procedure Laterality Date   ESOPHAGOGASTRODUODENOSCOPY     HARDWARE REMOVAL Left 06/04/2016   Procedure: HARDWARE REMOVAL WITH POSSIBLE BONE GRAFTING OF THE DISTAL FIBULAR FRACTURE;  Surgeon: Christena Flake, MD;  Location: [facility] ORS;  Service: Orthopedics;  Laterality: Left;   ORIF ANKLE FRACTURE Right 08/19/15   ORIF ANKLE FRACTURE Left 08/19/2015   Procedure: OPEN REDUCTION INTERNAL FIXATION (ORIF) ANKLE FRACTURE;  Surgeon: Christena Flake, MD;  Location: [facility] ORS;  Service: Orthopedics;   Laterality: Left;   PLANTAR'S WART EXCISION Right    Family History  Problem Relation Age of Onset   Hypertension Mother    Diabetes Father       Allergies and medications reviewed and updated.  Review of Systems  Per HPI unless specifically indicated above     Objective:    There were no vitals taken for this visit.  Wt Readings from Last 3 Encounters:  12/26/21 135 lb (61.2 kg)  04/01/19 120 lb (54.4 kg)  01/12/19 130 lb (59 kg)    Physical Exam  Results for orders placed or performed during the hospital encounter of 12/26/21  CBC  Result Value Ref Range   WBC 8.1 4.0 - 10.5 K/uL   RBC 5.03 4.22 - 5.81 MIL/uL   Hemoglobin 14.1 13.0 - 17.0 g/dL   HCT 72.0 94.7 - 09.6 %   MCV 86.3 80.0 - 100.0 fL   MCH 28.0 26.0 - 34.0 pg   MCHC 32.5 30.0 - 36.0 g/dL   RDW 28.3 66.2 - 94.7 %   Platelets 299 150 - 400 K/uL   nRBC 0.0 0.0 - 0.2 %  Basic metabolic panel  Result Value Ref Range   Sodium 141 135 - 145 mmol/L   Potassium 3.3 (L) 3.5 - 5.1 mmol/L   Chloride 103 98 - 111 mmol/L   CO2 28 22 - 32 mmol/L   Glucose, Bld 125 (H) 70 - 99 mg/dL   BUN 21 (H) 6 - 20 mg/dL   Creatinine, Ser 6.54 (H) 0.61 -  1.24 mg/dL   Calcium 9.4 8.9 - 10.3 mg/dL   GFR, Estimated >60 >60 mL/min   Anion gap 10 5 - 15  Troponin I (High Sensitivity)  Result Value Ref Range   Troponin I (High Sensitivity) 4 <18 ng/L      Assessment & Plan:   Problem List Items Addressed This Visit   None    Follow up plan: No follow-ups on file.

## 2022-01-02 ENCOUNTER — Ambulatory Visit (INDEPENDENT_AMBULATORY_CARE_PROVIDER_SITE_OTHER): Payer: Medicaid Other | Admitting: Nurse Practitioner

## 2022-01-02 ENCOUNTER — Encounter: Payer: Self-pay | Admitting: Nurse Practitioner

## 2022-01-02 VITALS — BP 93/58 | HR 80 | Temp 98.4°F | Ht 67.0 in | Wt 138.3 lb

## 2022-01-02 DIAGNOSIS — R1110 Vomiting, unspecified: Secondary | ICD-10-CM

## 2022-01-02 DIAGNOSIS — F209 Schizophrenia, unspecified: Secondary | ICD-10-CM | POA: Diagnosis not present

## 2022-01-02 DIAGNOSIS — F17200 Nicotine dependence, unspecified, uncomplicated: Secondary | ICD-10-CM | POA: Diagnosis not present

## 2022-01-02 NOTE — Assessment & Plan Note (Signed)
Current everyday smoker.  Smokes about 1 ppd.  Will discuss pneumonia show at next visit.

## 2022-01-02 NOTE — Assessment & Plan Note (Signed)
Chronic.  Has not been able to find a psychiatrist since he got out of prison about 2 years ago.  States the practice he was previously seeing no longer takes his insurance.  Referral placed for patient to establish care with new psychiatrist.

## 2022-02-04 NOTE — Progress Notes (Unsigned)
There were no vitals taken for this visit.   Subjective:    Patient ID: Lucas Guerra, male    DOB: Jul 29, 1985, 36 y.o.   MRN: [id number]  HPI: Lucas Guerra is a 36 y.o. male presenting on 02/05/2022 for comprehensive medical examination. Current medical complaints include:{Blank single:19197::"none","***"}  He currently lives with: Interim Problems from his last visit: {Blank single:19197::"yes","no"}  Depression Screen done today and results listed below:     01/02/2022    9:03 AM 05/05/2017    9:16 AM 10/06/2015    2:09 PM  Depression screen PHQ 2/9  Decreased Interest 0 3 0  Down, Depressed, Hopeless 0 3 0  PHQ - 2 Score 0 6 0  Altered sleeping 3 3   Tired, decreased energy 1 3   Change in appetite 0 3   Feeling bad or failure about yourself  3 1   Trouble concentrating 0 3   Moving slowly or fidgety/restless 0 0   Suicidal thoughts 0 0   PHQ-9 Score 7 19   Difficult doing work/chores  Very difficult     The patient {has/does not have:19849} a history of falls. I {did/did not:19850} complete a risk assessment for falls. A plan of care for falls {was/was not:19852} documented.   Past Medical History:  Past Medical History:  Diagnosis Date   Anxiety    GERD (gastroesophageal reflux disease)    Heart murmur    Paranoid schizophrenia (HCC) 11/02/2014   Plantar wart of right foot    Schizophrenia [facility])     Surgical History:  Past Surgical History:  Procedure Laterality Date   ESOPHAGOGASTRODUODENOSCOPY     HARDWARE REMOVAL Left 06/04/2016   Procedure: HARDWARE REMOVAL WITH POSSIBLE BONE GRAFTING OF THE DISTAL FIBULAR FRACTURE;  Surgeon: Christena Flake, MD;  Location: [facility] ORS;  Service: Orthopedics;  Laterality: Left;   ORIF ANKLE FRACTURE Right 08/19/15   ORIF ANKLE FRACTURE Left 08/19/2015   Procedure: OPEN REDUCTION INTERNAL FIXATION (ORIF) ANKLE FRACTURE;  Surgeon: Christena Flake, MD;  Location: [facility] ORS;  Service: Orthopedics;  Laterality: Left;   PLANTAR'S  WART EXCISION Right     Medications:  Current Outpatient Medications on File Prior to Visit  Medication Sig   OLANZapine (ZYPREXA) 5 MG tablet Take 1 tablet (5 mg total) by mouth 2 (two) times daily. (Patient not taking: Reported on 01/02/2022)   OLANZapine (ZYPREXA) 5 MG tablet Take 1 tablet (5 mg total) by mouth 2 (two) times daily. (Patient not taking: Reported on 01/02/2022)   pantoprazole (PROTONIX) 40 MG tablet TAKE 1 TABLET BY MOUTH 2 TIMES DAILY BEFORE MEALS.   No current facility-administered medications on file prior to visit.    Allergies:  Allergies  Allergen Reactions   Iohexol Cough    Patient experienced coughing episode immediately after receiving CT contrast; no wheezing or airway issues   Tramadol Rash    Social History:  Social History   Socioeconomic History   Marital status: Single    Spouse name: Not on file   Number of children: Not on file   Years of education: Not on file   Highest education level: Not on file  Occupational History   Not on file  Tobacco Use   Smoking status: Every Day    Packs/day: 1.00    Years: 12.00    Total pack years: 12.00    Types: Cigarettes   Smokeless tobacco: Never  Vaping Use   Vaping Use: Never used  Substance  and Sexual Activity   Alcohol use: No   Drug use: Not Currently    Types: Marijuana   Sexual activity: Yes  Other Topics Concern   Not on file  Social History Narrative   Not on file   Social Determinants of Health   Financial Resource Strain: Not on file  Food Insecurity: Not on file  Transportation Needs: Not on file  Physical Activity: Not on file  Stress: Not on file  Social Connections: Not on file  Intimate Partner Violence: Not on file   Social History   Tobacco Use  Smoking Status Every Day   Packs/day: 1.00   Years: 12.00   Total pack years: 12.00   Types: Cigarettes  Smokeless Tobacco Never   Social History   Substance and Sexual Activity  Alcohol Use No    Family  History:  Family History  Problem Relation Age of Onset   Hypertension Mother    Diabetes Father     Past medical history, surgical history, medications, allergies, family history and social history reviewed with patient today and changes made to appropriate areas of the chart.   ROS All other ROS negative except what is listed above and in the HPI.      Objective:    There were no vitals taken for this visit.  Wt Readings from Last 3 Encounters:  01/02/22 138 lb 4.8 oz (62.7 kg)  12/26/21 135 lb (61.2 kg)  04/01/19 120 lb (54.4 kg)    Physical Exam  Results for orders placed or performed during the hospital encounter of 12/26/21  CBC  Result Value Ref Range   WBC 8.1 4.0 - 10.5 K/uL   RBC 5.03 4.22 - 5.81 MIL/uL   Hemoglobin 14.1 13.0 - 17.0 g/dL   HCT 50.9 32.6 - 71.2 %   MCV 86.3 80.0 - 100.0 fL   MCH 28.0 26.0 - 34.0 pg   MCHC 32.5 30.0 - 36.0 g/dL   RDW 45.8 09.9 - 83.3 %   Platelets 299 150 - 400 K/uL   nRBC 0.0 0.0 - 0.2 %  Basic metabolic panel  Result Value Ref Range   Sodium 141 135 - 145 mmol/L   Potassium 3.3 (L) 3.5 - 5.1 mmol/L   Chloride 103 98 - 111 mmol/L   CO2 28 22 - 32 mmol/L   Glucose, Bld 125 (H) 70 - 99 mg/dL   BUN 21 (H) 6 - 20 mg/dL   Creatinine, Ser 8.25 (H) 0.61 - 1.24 mg/dL   Calcium 9.4 8.9 - 05.3 mg/dL   GFR, Estimated >97 >67 mL/min   Anion gap 10 5 - 15  Troponin I (High Sensitivity)  Result Value Ref Range   Troponin I (High Sensitivity) 4 <18 ng/L      Assessment & Plan:   Problem List Items Addressed This Visit       Other   Tobacco use disorder   Schizophrenia (HCC) - Primary     Discussed aspirin prophylaxis for myocardial infarction prevention and decision was {Blank single:19197::"it was not indicated","made to continue ASA","made to start ASA","made to stop ASA","that we recommended ASA, and patient refused"}  LABORATORY TESTING:  Health maintenance labs ordered today as discussed above.   The natural history  of prostate cancer and ongoing controversy regarding screening and potential treatment outcomes of prostate cancer has been discussed with the patient. The meaning of a false positive PSA and a false negative PSA has been discussed. He indicates understanding of the limitations  of this screening test and wishes *** to proceed with screening PSA testing.   IMMUNIZATIONS:   - Tdap: Tetanus vaccination status reviewed: {tetanus status:315746}. - Influenza: {Blank single:19197::"Up to date","Administered today","Postponed to flu season","Refused","Given elsewhere"} - Pneumovax: {Blank single:19197::"Up to date","Administered today","Not applicable","Refused","Given elsewhere"} - Prevnar: {Blank single:19197::"Up to date","Administered today","Not applicable","Refused","Given elsewhere"} - COVID: {Blank single:19197::"Up to date","Administered today","Not applicable","Refused","Given elsewhere"} - HPV: {Blank single:19197::"Up to date","Administered today","Not applicable","Refused","Given elsewhere"} - Shingrix vaccine: {Blank single:19197::"Up to date","Administered today","Not applicable","Refused","Given elsewhere"}  SCREENING: - Colonoscopy: {Blank single:19197::"Up to date","Ordered today","Not applicable","Refused","Done elsewhere"}  Discussed with patient purpose of the colonoscopy is to detect colon cancer at curable precancerous or early stages   - AAA Screening: {Blank single:19197::"Up to date","Ordered today","Not applicable","Refused","Done elsewhere"}  -Hearing Test: {Blank single:19197::"Up to date","Ordered today","Not applicable","Refused","Done elsewhere"}  -Spirometry: {Blank single:19197::"Up to date","Ordered today","Not applicable","Refused","Done elsewhere"}   PATIENT COUNSELING:    Sexuality: Discussed sexually transmitted diseases, partner selection, use of condoms, avoidance of unintended pregnancy  and contraceptive alternatives.   Advised to avoid cigarette  smoking.  I discussed with the patient that most people either abstain from alcohol or drink within safe limits (<=14/week and <=4 drinks/occasion for males, <=7/weeks and <= 3 drinks/occasion for females) and that the risk for alcohol disorders and other health effects rises proportionally with the number of drinks per week and how often a drinker exceeds daily limits.  Discussed cessation/primary prevention of drug use and availability of treatment for abuse.   Diet: Encouraged to adjust caloric intake to maintain  or achieve ideal body weight, to reduce intake of dietary saturated fat and total fat, to limit sodium intake by avoiding high sodium foods and not adding table salt, and to maintain adequate dietary potassium and calcium preferably from fresh fruits, vegetables, and low-fat dairy products.    stressed the importance of regular exercise  Injury prevention: Discussed safety belts, safety helmets, smoke detector, smoking near bedding or upholstery.   Dental health: Discussed importance of regular tooth brushing, flossing, and dental visits.   Follow up plan: NEXT PREVENTATIVE PHYSICAL DUE IN 1 YEAR. No follow-ups on file.

## 2022-02-05 ENCOUNTER — Ambulatory Visit (INDEPENDENT_AMBULATORY_CARE_PROVIDER_SITE_OTHER): Payer: Medicaid Other | Admitting: Nurse Practitioner

## 2022-02-05 ENCOUNTER — Encounter: Payer: Self-pay | Admitting: Nurse Practitioner

## 2022-02-05 VITALS — BP 100/66 | HR 67 | Temp 98.7°F | Ht 66.0 in | Wt 145.8 lb

## 2022-02-05 DIAGNOSIS — F17200 Nicotine dependence, unspecified, uncomplicated: Secondary | ICD-10-CM | POA: Diagnosis not present

## 2022-02-05 DIAGNOSIS — Z23 Encounter for immunization: Secondary | ICD-10-CM | POA: Diagnosis not present

## 2022-02-05 DIAGNOSIS — Z0000 Encounter for general adult medical examination without abnormal findings: Secondary | ICD-10-CM | POA: Diagnosis not present

## 2022-02-05 DIAGNOSIS — Z1159 Encounter for screening for other viral diseases: Secondary | ICD-10-CM

## 2022-02-05 DIAGNOSIS — Z789 Other specified health status: Secondary | ICD-10-CM

## 2022-02-05 DIAGNOSIS — Z114 Encounter for screening for human immunodeficiency virus [HIV]: Secondary | ICD-10-CM

## 2022-02-05 DIAGNOSIS — F200 Paranoid schizophrenia: Secondary | ICD-10-CM | POA: Diagnosis not present

## 2022-02-05 LAB — URINALYSIS, ROUTINE W REFLEX MICROSCOPIC
Bilirubin, UA: NEGATIVE
Glucose, UA: NEGATIVE
Leukocytes,UA: NEGATIVE
Nitrite, UA: NEGATIVE
RBC, UA: NEGATIVE
Specific Gravity, UA: 1.02 (ref 1.005–1.030)
Urobilinogen, Ur: 1 mg/dL (ref 0.2–1.0)
pH, UA: 6.5 (ref 5.0–7.5)

## 2022-02-05 NOTE — Assessment & Plan Note (Signed)
Chronic. Has an appt with Psychiatry in December.  Looking forward to the appt to help with anxiety.

## 2022-02-09 LAB — COMPREHENSIVE METABOLIC PANEL
ALT: 14 IU/L (ref 0–44)
AST: 15 IU/L (ref 0–40)
Albumin/Globulin Ratio: 1.8 (ref 1.2–2.2)
Albumin: 4.4 g/dL (ref 4.1–5.1)
Alkaline Phosphatase: 83 IU/L (ref 44–121)
BUN/Creatinine Ratio: 12 (ref 9–20)
BUN: 13 mg/dL (ref 6–20)
Bilirubin Total: 0.5 mg/dL (ref 0.0–1.2)
CO2: 29 mmol/L (ref 20–29)
Calcium: 10.2 mg/dL (ref 8.7–10.2)
Chloride: 98 mmol/L (ref 96–106)
Creatinine, Ser: 1.11 mg/dL (ref 0.76–1.27)
Globulin, Total: 2.4 g/dL (ref 1.5–4.5)
Glucose: 88 mg/dL (ref 70–99)
Potassium: 4.8 mmol/L (ref 3.5–5.2)
Sodium: 139 mmol/L (ref 134–144)
Total Protein: 6.8 g/dL (ref 6.0–8.5)
eGFR: 88 mL/min/{1.73_m2} (ref >59)

## 2022-02-09 LAB — CBC WITH DIFFERENTIAL/PLATELET
Basophils Absolute: 0.1 10*3/uL (ref 0.0–0.2)
Basos: 1 %
EOS (ABSOLUTE): 0.2 10*3/uL (ref 0.0–0.4)
Eos: 4 %
Hematocrit: 41.6 % (ref 37.5–51.0)
Hemoglobin: 14.3 g/dL (ref 13.0–17.7)
Immature Grans (Abs): 0 10*3/uL (ref 0.0–0.1)
Immature Granulocytes: 0 %
Lymphocytes Absolute: 2.7 10*3/uL (ref 0.7–3.1)
Lymphs: 48 %
MCH: 28.8 pg (ref 26.6–33.0)
MCHC: 34.4 g/dL (ref 31.5–35.7)
MCV: 84 fL (ref 79–97)
Monocytes Absolute: 0.5 10*3/uL (ref 0.1–0.9)
Monocytes: 8 %
Neutrophils Absolute: 2.2 10*3/uL (ref 1.4–7.0)
Neutrophils: 39 %
Platelets: 268 10*3/uL (ref 150–450)
RBC: 4.97 x10E6/uL (ref 4.14–5.80)
RDW: 11.8 % (ref 11.6–15.4)
WBC: 5.6 10*3/uL (ref 3.4–10.8)

## 2022-02-09 LAB — HIV 1/2 AB DIFFERENTIATION
HIV 1 Ab: NONREACTIVE
HIV 2 Ab: NONREACTIVE
NOTE (HIV CONF MULTIP: NEGATIVE

## 2022-02-09 LAB — LIPID PANEL
Chol/HDL Ratio: 2.9 ratio (ref 0.0–5.0)
Cholesterol, Total: 194 mg/dL (ref 100–199)
HDL: 66 mg/dL (ref >39)
LDL Chol Calc (NIH): 118 mg/dL — ABNORMAL HIGH (ref 0–99)
Triglycerides: 56 mg/dL (ref 0–149)
VLDL Cholesterol Cal: 10 mg/dL (ref 5–40)

## 2022-02-09 LAB — TSH: TSH: 0.519 u[IU]/mL (ref 0.450–4.500)

## 2022-02-09 LAB — HIV-1/HIV-2 QUALITATIVE RNA
Final Interpretation: NEGATIVE
HIV-1 RNA, Qualitative: NONREACTIVE
HIV-2 RNA, Qualitative: NONREACTIVE

## 2022-02-09 LAB — HEPATITIS C ANTIBODY: Hep C Virus Ab: NONREACTIVE

## 2022-02-09 LAB — HIV ANTIBODY (ROUTINE TESTING W REFLEX): HIV Screen 4th Generation wRfx: REACTIVE

## 2022-02-11 NOTE — Progress Notes (Signed)
Please let patient know that his lab work shows that his cholesterol is slightly elevated.  I recommend a low fat diet.  His liver, kidneys and electrolytes look good.  Hi Hepatitis C screening was negative.  However, his HIV screening came back inconclusive.  It looks like it is likely negative.  However, I would like him to see an infectious disease doctor to make sure it is negative.  I have placed the order.

## 2022-02-11 NOTE — Addendum Note (Signed)
Addended by: Larae Grooms on: 02/11/2022 12:46 PM   Modules accepted: Orders

## 2022-02-19 ENCOUNTER — Ambulatory Visit (HOSPITAL_COMMUNITY): Payer: Self-pay | Admitting: Student in an Organized Health Care Education/Training Program

## 2022-03-05 ENCOUNTER — Ambulatory Visit: Payer: Medicaid Other | Admitting: Infectious Diseases

## 2022-03-07 ENCOUNTER — Ambulatory Visit (HOSPITAL_COMMUNITY): Payer: Self-pay | Admitting: Psychiatry

## 2022-04-02 ENCOUNTER — Encounter: Payer: Self-pay | Admitting: Infectious Diseases

## 2022-04-02 ENCOUNTER — Ambulatory Visit: Payer: Medicaid Other | Attending: Infectious Diseases | Admitting: Infectious Diseases

## 2022-04-02 ENCOUNTER — Other Ambulatory Visit
Admission: RE | Admit: 2022-04-02 | Discharge: 2022-04-02 | Disposition: A | Discharge status: Home / Self Care | Payer: Medicaid Other | Attending: Infectious Diseases | Admitting: Infectious Diseases

## 2022-04-02 VITALS — BP 113/79 | HR 98 | Temp 97.3°F | Ht 66.0 in | Wt 147.0 lb

## 2022-04-02 DIAGNOSIS — Z202 Contact with and (suspected) exposure to infections with a predominantly sexual mode of transmission: Secondary | ICD-10-CM | POA: Insufficient documentation

## 2022-04-02 DIAGNOSIS — Z114 Encounter for screening for human immunodeficiency virus [HIV]: Secondary | ICD-10-CM | POA: Insufficient documentation

## 2022-04-02 NOTE — Patient Instructions (Signed)
You are here for an indeteminate test for Hiv and the confirmatory test was negative- You can take a pill to protect yourself from cathching HIV if you have sex with multiple male partners without condom . You say you dont need that pill now- follow up PRN- will check HIV RNA toda

## 2022-04-02 NOTE — Progress Notes (Signed)
NAME: Lucas Guerra  DOB: [birth year]  MRN: [id number]  Date/Time: 04/02/2022 11:34 AM   Subjective:   ? Lucas Guerra is a 37 y.o. male was referred to be for a preliminary HIV test being positive but negative confirmatory test He has male partners and got a routine test with is PCP in Nov  HE denies sex with HIV positive individuals Denies sex with men No dysuria, penile discharge, or rash  Past Medical History:  Diagnosis Date   Anxiety    GERD (gastroesophageal reflux disease)    Heart murmur    Paranoid schizophrenia (HCC) 11/02/2014   Plantar wart of right foot    Schizophrenia (HCC)     Past Surgical History:  Procedure Laterality Date   ESOPHAGOGASTRODUODENOSCOPY     HARDWARE REMOVAL Left 06/04/2016   Procedure: HARDWARE REMOVAL WITH POSSIBLE BONE GRAFTING OF THE DISTAL FIBULAR FRACTURE;  Surgeon: Christena Flake, MD;  Location: [facility] ORS;  Service: Orthopedics;  Laterality: Left;   ORIF ANKLE FRACTURE Right 08/19/15   ORIF ANKLE FRACTURE Left 08/19/2015   Procedure: OPEN REDUCTION INTERNAL FIXATION (ORIF) ANKLE FRACTURE;  Surgeon: Christena Flake, MD;  Location: [facility] ORS;  Service: Orthopedics;  Laterality: Left;   PLANTAR'S WART EXCISION Right     Social History   Socioeconomic History   Marital status: Single    Spouse name: Not on file   Number of children: Not on file   Years of education: Not on file   Highest education level: Not on file  Occupational History   Not on file  Tobacco Use   Smoking status: Every Day    Packs/day: 1.00    Years: 12.00    Total pack years: 12.00    Types: Cigarettes   Smokeless tobacco: Never  Vaping Use   Vaping Use: Never used  Substance and Sexual Activity   Alcohol use: No   Drug use: Not Currently    Types: Marijuana   Sexual activity: Yes    Partners: Female  Other Topics Concern   Not on file  Social History Narrative   Not on file   Social Determinants of Health   Financial Resource Strain: Not on file   Food Insecurity: Not on file  Transportation Needs: Not on file  Physical Activity: Not on file  Stress: Not on file  Social Connections: Not on file  Intimate Partner Violence: Not on file    Family History  Problem Relation Age of Onset   Hypertension Mother    Diabetes Father    Allergies  Allergen Reactions   Iohexol Cough    Patient experienced coughing episode immediately after receiving CT contrast; no wheezing or airway issues   Tramadol Rash   I? No current outpatient medications on file.   No current facility-administered medications for this visit.     Abtx:  Anti-infectives (From admission, onward)    None       REVIEW OF SYSTEMS:  Const: negative fever, negative chills, negative weight loss Eyes: negative diplopia or visual changes, negative eye pain ENT: negative coryza, negative sore throat Resp: negative cough, hemoptysis, dyspnea Cards: negative for chest pain, palpitations, lower extremity edema GU: negative for frequency, dysuria and hematuria GI: Negative for abdominal pain, diarrhea, bleeding, constipation Skin: negative for rash and pruritus Heme: negative for easy bruising and gum/nose bleeding MS: negative for myalgias, arthralgias, back pain and muscle weakness Neurolo:negative for headaches, dizziness, vertigo, memory problems  Psych: negative for feelings of anxiety, depression  Endocrine: negative for thyroid, diabetes Allergy/Immunology- negative for any medication or food allergies ?  Objective:  VITALS:  BP 113/79   Pulse 98   Temp (!) 97.3 F (36.3 C) (Temporal)   Ht 5\' 6"  (1.676 m)   Wt 147 lb (66.7 kg)   BMI 23.73 kg/m  LDA Foley Central line Other drainage tubes PHYSICAL EXAM:  General: Alert, cooperative, no distress, appears stated age.  Head: Normocephalic, without obvious abnormality, atraumatic. Eyes: Conjunctivae clear, anicteric sclerae. Pupils are equal ENT Nares normal. No drainage or sinus  tenderness. Lips, mucosa, and tongue normal. No Thrush Neck: Supple, symmetrical, no adenopathy, thyroid: non tender no carotid bruit and no JVD. Back: No CVA tenderness. Lungs: Clear to auscultation bilaterally. No Wheezing or Rhonchi. No rales. Heart: Regular rate and rhythm, no murmur, rub or gallop. Abdomen: Soft, non-tender,not distended. Bowel sounds normal. No masses Extremities: atraumatic, no cyanosis. No edema. No clubbing Skin: No rashes or lesions. Or bruising Lymph: Cervical, supraclavicular normal. Neurologic: Grossly non-focal Pertinent Labs  Latest Reference Range & Units 02/05/22 10:26  HIV Screen 4th Generation wRfx Non Reactive  Preliminary Reactive  HIV 1 Ab Non Reactive  Non Reactive  HIV 2 Ab Non Reactive  Non Reactive  Note  Negative  HIV-1 RNA, Qualitative, TMA Non Reactive  Non Reactive  HIV-2 RNA, Qualitative Non Reactive  Non Reactive  Final Interpretation  HIV Negative    ? Impression/Recommendation False positive Preliminary HIV test but negative confirmatory test- Will get HIV RNA and also RPR  Offered patient PreP with Truvada but he refused- said if needed he will come back Discussed safe sex ? Follow PRN? ___________________________________________________  Note:  This document was prepared using Dragon voice recognition software and may include unintentional dictation errors.

## 2022-04-03 LAB — HIV-1 RNA QUANT-NO REFLEX-BLD
HIV 1 RNA Quant: <20 copies/mL
LOG10 HIV-1 RNA: UNDETERMINED log10copy/mL

## 2022-04-09 ENCOUNTER — Telehealth: Payer: Self-pay

## 2022-04-09 NOTE — Telephone Encounter (Signed)
-----  Message from Tsosie Billing, MD sent at 04/08/2022  4:28 PM EST ----- Please let him know that HIV test is negative- he will need to come back for RPR. Thx  ----- Message ----- From: Buel Ream, [facility] [place] Sent: 04/03/2022   5:36 PM EST To: Tsosie Billing, MD

## 2022-04-09 NOTE — Telephone Encounter (Signed)
Patient advised that HIV test was negative and to go by the [facility] lab to have RPR drawn.  [place] Donivan Thammavong, CMA

## 2022-05-08 ENCOUNTER — Ambulatory Visit: Payer: Medicaid Other | Admitting: Nurse Practitioner

## 2022-05-08 NOTE — Progress Notes (Deleted)
   There were no vitals taken for this visit.   Subjective:    Patient ID: RUFORD DUDZINSKI, male    DOB: May 15, 1985, 37 y.o.   MRN: [id number]  HPI: DEVON PRETTY is a 37 y.o. male  No chief complaint on file.  MOOD   Relevant past medical, surgical, family and social history reviewed and updated as indicated. Interim medical history since our last visit reviewed. Allergies and medications reviewed and updated.  Review of Systems  Per HPI unless specifically indicated above     Objective:    There were no vitals taken for this visit.  Wt Readings from Last 3 Encounters:  04/02/22 147 lb (66.7 kg)  02/05/22 145 lb 12.8 oz (66.1 kg)  01/02/22 138 lb 4.8 oz (62.7 kg)    Physical Exam  Results for orders placed or performed during the hospital encounter of 04/02/22  HIV-1 RNA quant-no reflex-bld  Result Value Ref Range   HIV 1 RNA Quant <20 copies/mL   LOG10 HIV-1 RNA UNABLE TO CALCULATE log10copy/mL      Assessment & Plan:   Problem List Items Addressed This Visit   None    Follow up plan: No follow-ups on file.

## 2022-09-27 ENCOUNTER — Emergency Department (HOSPITAL_COMMUNITY)
Admission: EM | Admit: 2022-09-27 | Discharge: 2022-09-27 | Discharge status: Against Medical Advice | Payer: MEDICAID | Source: Home / Self Care

## 2022-09-29 ENCOUNTER — Emergency Department (HOSPITAL_COMMUNITY)
Admission: EM | Admit: 2022-09-29 | Discharge: 2022-09-30 | Disposition: A | Discharge status: Home / Self Care | Payer: MEDICAID | Attending: Emergency Medicine | Admitting: Emergency Medicine

## 2022-09-29 ENCOUNTER — Encounter (HOSPITAL_COMMUNITY): Payer: Self-pay | Admitting: Emergency Medicine

## 2022-09-29 DIAGNOSIS — K5900 Constipation, unspecified: Secondary | ICD-10-CM | POA: Insufficient documentation

## 2022-09-29 LAB — CBC WITH DIFFERENTIAL/PLATELET
Abs Immature Granulocytes: 0.01 10*3/uL (ref 0.00–0.07)
Basophils Absolute: 0 10*3/uL (ref 0.0–0.1)
Basophils Relative: 1 %
Eosinophils Absolute: 0.3 10*3/uL (ref 0.0–0.5)
Eosinophils Relative: 6 %
HCT: 39.1 % (ref 39.0–52.0)
Hemoglobin: 13.1 g/dL (ref 13.0–17.0)
Immature Granulocytes: 0 %
Lymphocytes Relative: 39 %
Lymphs Abs: 2 10*3/uL (ref 0.7–4.0)
MCH: 28.6 pg (ref 26.0–34.0)
MCHC: 33.5 g/dL (ref 30.0–36.0)
MCV: 85.4 fL (ref 80.0–100.0)
Monocytes Absolute: 0.5 10*3/uL (ref 0.1–1.0)
Monocytes Relative: 10 %
Neutro Abs: 2.2 10*3/uL (ref 1.7–7.7)
Neutrophils Relative %: 44 %
Platelets: 256 10*3/uL (ref 150–400)
RBC: 4.58 MIL/uL (ref 4.22–5.81)
RDW: 12.4 % (ref 11.5–15.5)
WBC: 5 10*3/uL (ref 4.0–10.5)
nRBC: 0 % (ref 0.0–0.2)

## 2022-09-29 MED ORDER — LINACLOTIDE 145 MCG PO CAPS: 145.0000 ug | ORAL_CAPSULE | Freq: Every day | ORAL | 0 refills | Status: DC

## 2022-09-29 NOTE — ED Triage Notes (Signed)
Pt states they haven't had a BM in almost 2 weeks. Pt has tried stool softeners and other OTC medications without any relief. Pt states due to not being able to have a BM, that when pt eats, he vomits due to being "backed up".

## 2022-09-29 NOTE — ED Provider Notes (Signed)
[facility] AT [facility] Provider Note   CSN: [id number] Arrival date & time: 09/29/22  2234     History {Add pertinent medical, surgical, social history, OB history to HPI:1} Chief Complaint  Patient presents with   Constipation    Lucas Guerra is a 36 y.o. male.   Constipation  This patient is a 37 year old male, presents with a complaint of abdominal discomfort and not being able to have a bowel movement in the last 2 weeks.  He states that despite using laxatives, stool softeners and an enema he has only had very small amounts of stool that come out like small little balls.  The patient reports that he is having some difficulty eating because when he tries to eat it causes some increased pressure in the upper abdomen and chest so he has not been eating very much either.  He does endorse a history of opiate use and states that his last use was just over 1 month ago.  He was in prison several years ago and recalls that over time he has had some difficulty with opiate use and chronic constipation but has not used in some time which made him concerned that he was now having significant difficulty having bowel movements.  He denies any blood in the stool, denies any urinary symptoms, denies any prior abdominal surgical history, takes no daily medications either for medical problems or behavioral problems despite being diagnosed with psychiatric problems in the past.    Home Medications Prior to Admission medications   Not on File      Allergies    Iohexol and Tramadol    Review of Systems   Review of Systems  Gastrointestinal:  Positive for constipation.  All other systems reviewed and are negative.   Physical Exam Updated Vital Signs BP 121/87 (BP Location: Left Arm)   Pulse 88   Temp 98.1 F (36.7 C) (Oral)   Resp 16   SpO2 99%  Physical Exam Vitals and nursing note reviewed.  Constitutional:      General: He is not in acute  distress.    Appearance: He is well-developed.  HENT:     Head: Normocephalic and atraumatic.     Mouth/Throat:     Pharynx: No oropharyngeal exudate.  Eyes:     General: No scleral icterus.       Right eye: No discharge.        Left eye: No discharge.     Conjunctiva/sclera: Conjunctivae normal.     Pupils: Pupils are equal, round, and reactive to light.  Neck:     Thyroid: No thyromegaly.     Vascular: No JVD.  Cardiovascular:     Rate and Rhythm: Normal rate and regular rhythm.     Heart sounds: Normal heart sounds. No murmur heard.    No friction rub. No gallop.  Pulmonary:     Effort: Pulmonary effort is normal. No respiratory distress.     Breath sounds: Normal breath sounds. No wheezing or rales.  Abdominal:     General: Bowel sounds are normal. There is no distension.     Palpations: Abdomen is soft. There is no mass.     Tenderness: There is no abdominal tenderness.     Comments: Abdomen with minimal tenderness in the epigastrium, no other abdominal tenderness, no masses, no peritoneal signs or guarding, no pain at McBurney's point, no Murphy sign  Musculoskeletal:        General: No  tenderness. Normal range of motion.     Cervical back: Normal range of motion and neck supple.     Right lower leg: No edema.     Left lower leg: No edema.  Lymphadenopathy:     Cervical: No cervical adenopathy.  Skin:    General: Skin is warm and dry.     Findings: No erythema or rash.  Neurological:     Mental Status: He is alert.     Coordination: Coordination normal.  Psychiatric:        Behavior: Behavior normal.     ED Results / Procedures / Treatments   Labs (all labs ordered are listed, but only abnormal results are displayed) Labs Reviewed - No data to display  EKG None  Radiology No results found.  Procedures Procedures  {Document cardiac monitor, telemetry assessment procedure when appropriate:1}  Medications Ordered in ED Medications - No data to  display  ED Course/ Medical Decision Making/ A&P   {   Click here for ABCD2, HEART and other calculatorsREFRESH Note before signing :1}                          Medical Decision Making  Overall the patient does not appear to be in distress, his vital signs are normal, he has an abdomen which is nonsurgical, he is having some pain with eating which raises suspicion for thing such as pancreatitis or cholecystitis although I suspect that it may just be the underlying constipation that is driving the big picture.  Will obtain a CBC a CMP and a lipase, if these tests are normal I would prescribe Linzess as well as have him take MiraLAX 3 times a day and have the patient follow-up with gastroenterology.  He does not have a local family doctor, he will be given a list of local primary care physicians as well as gastroenterology.  The patient is agreeable to the plan.  At the time of change of shift care signed out to oncoming physician to follow-up results and disposition accordingly  {Document critical care time when appropriate:1} {Document review of labs and clinical decision tools ie heart score, Chads2Vasc2 etc:1}  {Document your independent review of radiology images, and any outside records:1} {Document your discussion with family members, caretakers, and with consultants:1} {Document social determinants of health affecting pt's care:1} {Document your decision making why or why not admission, treatments were needed:1} Final Clinical Impression(s) / ED Diagnoses Final diagnoses:  None    Rx / DC Orders ED Discharge Orders     None

## 2022-09-29 NOTE — Discharge Instructions (Addendum)
    [facility] Doctor List    Margaret Simpson, MD. Specialty: Family Medicine Contact information: [address]  [place] [address]  [phone]   Scott Luking, MD. Specialty: Family Medicine Contact information: [address]  [address]  [place] [address]  [phone]   Tesfaye Fanta, MD Specialty: Internal Medicine Contact information: [address]   [place] [address]  [phone]   Zach Hall, MD. Specialty: Internal Medicine Contact information: [address]  [place] [address]  [phone]    [facility] (Dr. Kim) Specialty: Family Medicine Contact information: [address]  [place] [address]  [phone]   Stephen Knowlton, MD. Specialty: Family Medicine Contact information: [address]  [address]  [place] [address]  [phone]   Roy Fagan, MD. Specialty: Internal Medicine Contact information: [address]  [address]   [place] [address]  [phone]    [facility]  [address] , [place] [address] [phone]  Services [facility] offers a variety of basic health services.  Services include but are not limited to: Blood pressure checks  Heart rate checks  Blood sugar checks  Urine analysis  Rapid strep tests  Pregnancy tests.  Health education and referrals  People needing more complex services will be directed to a physician online. Using these virtual visits, doctors can evaluate and prescribe medicine and treatments. There will be no medication on-site, though [facility] will help patients fill their prescriptions at little to no cost.   For More information please go to: [email or web address]  

## 2022-09-29 NOTE — ED Notes (Signed)
ED Provider at bedside. 

## 2022-09-30 LAB — COMPREHENSIVE METABOLIC PANEL
ALT: 11 U/L (ref 0–44)
AST: 14 U/L — ABNORMAL LOW (ref 15–41)
Albumin: 4 g/dL (ref 3.5–5.0)
Alkaline Phosphatase: 85 U/L (ref 38–126)
Anion gap: 10 (ref 5–15)
BUN: 15 mg/dL (ref 6–20)
CO2: 30 mmol/L (ref 22–32)
Calcium: 9.3 mg/dL (ref 8.9–10.3)
Chloride: 98 mmol/L (ref 98–111)
Creatinine, Ser: 1.13 mg/dL (ref 0.61–1.24)
GFR, Estimated: >60 mL/min (ref >60)
Glucose, Bld: 87 mg/dL (ref 70–99)
Potassium: 3.5 mmol/L (ref 3.5–5.1)
Sodium: 138 mmol/L (ref 135–145)
Total Bilirubin: 0.5 mg/dL (ref 0.3–1.2)
Total Protein: 7.2 g/dL (ref 6.5–8.1)

## 2022-09-30 LAB — LIPASE, BLOOD: Lipase: 30 U/L (ref 11–51)

## 2022-10-03 ENCOUNTER — Telehealth: Payer: Self-pay

## 2022-10-03 NOTE — Telephone Encounter (Signed)
Patient return call and is wanting to make a appointment. Informed patient he is a establish patient with [facility] and needs to call there office to make a appointment. He states he will do so

## 2022-10-03 NOTE — Telephone Encounter (Signed)
Patient called and left a voicemail but did not say what he was calling about. Return patient call and left a message for call back

## 2022-10-04 ENCOUNTER — Telehealth: Payer: Self-pay

## 2022-10-04 NOTE — Telephone Encounter (Signed)
-----   Message from [facility] sent at 10/03/2022 11:42 AM EDT ----- Patient needs TOC call completed please.

## 2022-10-04 NOTE — Transitions of Care (Post Inpatient/ED Visit) (Signed)
   10/04/2022  Name: Lucas Guerra MRN: [id number] DOB: 09-12-1985  Today's TOC FU Call Status: Today's TOC FU Call Status:: Successful TOC FU Call Competed TOC FU Call Complete Date: 10/04/22  Transition Care Management Follow-up Telephone Call Date of Discharge: 09/29/22 Discharge Facility: [facility]) Type of Discharge: Emergency Department Reason for ED Visit: Other: How have you been since you were released from the hospital?: Same Any questions or concerns?: No  Items Reviewed: Did you receive and understand the discharge instructions provided?: Yes Medications obtained,verified, and reconciled?: No Any new allergies since your discharge?: No Dietary orders reviewed?: No Do you have support at home?: No  Medications Reviewed Today: Medications Reviewed Today     Reviewed by Judith Part, CMA (Certified Medical Assistant) on 04/02/22 at 1103  Med List Status: <None>   Medication Order Taking? Sig Documenting Provider Last Dose Status Informant           No Medications to Display                            Home Care and Equipment/Supplies: Were Home Health Services Ordered?: NA Any new equipment or medical supplies ordered?: NA  Functional Questionnaire: Do you need assistance with bathing/showering or dressing?: No Do you need assistance with meal preparation?: No Do you need assistance with eating?: No Do you have difficulty maintaining continence: No Do you need assistance with getting out of bed/getting out of a chair/moving?: No Do you have difficulty managing or taking your medications?: No  Follow up appointments reviewed: PCP Follow-up appointment confirmed?: Yes MD Provider Line Number:[phone] Given: No Specialist Hospital Follow-up appointment confirmed?: NA Do you need transportation to your follow-up appointment?: No Do you understand care options if your condition(s) worsen?: Yes-patient verbalized  understanding    SIGNATURE Malen Gauze, CMA

## 2022-10-14 ENCOUNTER — Ambulatory Visit: Payer: MEDICAID | Admitting: Family Medicine

## 2023-04-21 ENCOUNTER — Ambulatory Visit (INDEPENDENT_AMBULATORY_CARE_PROVIDER_SITE_OTHER): Payer: MEDICAID | Admitting: Nurse Practitioner

## 2023-04-21 ENCOUNTER — Encounter: Payer: Self-pay | Admitting: Nurse Practitioner

## 2023-04-21 VITALS — BP 131/84 | HR 102 | Temp 98.9°F | Ht 66.0 in | Wt 136.8 lb

## 2023-04-21 DIAGNOSIS — F200 Paranoid schizophrenia: Secondary | ICD-10-CM

## 2023-04-21 NOTE — Progress Notes (Signed)
BP 131/84 (BP Location: Right Arm, Patient Position: Sitting, Cuff Size: Normal)   Pulse (!) 102   Temp 98.9 F (37.2 C) (Oral)   Ht 5\' 6"  (1.676 m)   Wt 136 lb 12.8 oz (62.1 kg)   SpO2 98%   BMI 22.08 kg/m    Subjective:    Patient ID: Lucas Guerra, male    DOB: 1985-04-07, 38 y.o.   MRN: [id number]  HPI: Lucas Guerra is a 38 y.o. male  Chief Complaint  Patient presents with   Mental Health Problem   MOOD Patient states over the last three weeks he has not been doing well.  His mom recently passed and that has sent him into a spiral.  He is waking up with panic attacks and having them a couple of times a day.  His mom was his best friend.  He went to [facility] and didn't have a great experience.    Flowsheet Row Office Visit from 04/21/2023 in [facility]  PHQ-9 Total Score 12         04/21/2023    1:51 PM 02/05/2022   10:25 AM 01/02/2022    9:04 AM  GAD 7 : Generalized Anxiety Score  Nervous, Anxious, on Edge 3 2 3   Control/stop worrying 3 0 1  Worry too much - different things 3 0 1  Trouble relaxing 3 3 3   Restless 2 1 1   Easily annoyed or irritable 2 1 3   Afraid - awful might happen 0 0 0  Total GAD 7 Score 16 7 12   Anxiety Difficulty  Very difficult       Relevant past medical, surgical, family and social history reviewed and updated as indicated. Interim medical history since our last visit reviewed. Allergies and medications reviewed and updated.  Review of Systems  Psychiatric/Behavioral:  Positive for dysphoric mood and sleep disturbance. Negative for suicidal ideas. The patient is nervous/anxious.     Per HPI unless specifically indicated above     Objective:    BP 131/84 (BP Location: Right Arm, Patient Position: Sitting, Cuff Size: Normal)   Pulse (!) 102   Temp 98.9 F (37.2 C) (Oral)   Ht 5\' 6"  (1.676 m)   Wt 136 lb 12.8 oz (62.1 kg)   SpO2 98%   BMI 22.08 kg/m   Wt Readings from Last 3 Encounters:   04/21/23 136 lb 12.8 oz (62.1 kg)  04/02/22 147 lb (66.7 kg)  02/05/22 145 lb 12.8 oz (66.1 kg)    Physical Exam Vitals and nursing note reviewed.  Constitutional:      General: He is not in acute distress.    Appearance: Normal appearance. He is not ill-appearing, toxic-appearing or diaphoretic.  HENT:     Head: Normocephalic.     Right Ear: External ear normal.     Left Ear: External ear normal.     Nose: Nose normal. No congestion or rhinorrhea.     Mouth/Throat:     Mouth: Mucous membranes are moist.  Eyes:     General:        Right eye: No discharge.        Left eye: No discharge.     Extraocular Movements: Extraocular movements intact.     Conjunctiva/sclera: Conjunctivae normal.     Pupils: Pupils are equal, round, and reactive to light.  Cardiovascular:     Rate and Rhythm: Normal rate and regular rhythm.     Heart sounds: No  murmur heard. Pulmonary:     Effort: Pulmonary effort is normal. No respiratory distress.     Breath sounds: Normal breath sounds. No wheezing, rhonchi or rales.  Abdominal:     General: Abdomen is flat. Bowel sounds are normal.  Musculoskeletal:     Cervical back: Normal range of motion and neck supple.  Skin:    General: Skin is warm and dry.     Capillary Refill: Capillary refill takes less than 2 seconds.  Neurological:     General: No focal deficit present.     Mental Status: He is alert and oriented to person, place, and time.  Psychiatric:        Mood and Affect: Mood normal.        Behavior: Behavior normal.        Thought Content: Thought content normal.        Judgment: Judgment normal.     Results for orders placed or performed during the hospital encounter of 09/29/22  Comprehensive metabolic panel   Collection Time: 09/29/22 11:50 PM  Result Value Ref Range   Sodium 138 135 - 145 mmol/L   Potassium 3.5 3.5 - 5.1 mmol/L   Chloride 98 98 - 111 mmol/L   CO2 30 22 - 32 mmol/L   Glucose, Bld 87 70 - 99 mg/dL   BUN 15 6 -  20 mg/dL   Creatinine, Ser 6.29 0.61 - 1.24 mg/dL   Calcium 9.3 8.9 - 52.8 mg/dL   Total Protein 7.2 6.5 - 8.1 g/dL   Albumin 4.0 3.5 - 5.0 g/dL   AST 14 (L) 15 - 41 U/L   ALT 11 0 - 44 U/L   Alkaline Phosphatase 85 38 - 126 U/L   Total Bilirubin 0.5 0.3 - 1.2 mg/dL   GFR, Estimated >41 >32 mL/min   Anion gap 10 5 - 15  CBC with Differential/Platelet   Collection Time: 09/29/22 11:50 PM  Result Value Ref Range   WBC 5.0 4.0 - 10.5 K/uL   RBC 4.58 4.22 - 5.81 MIL/uL   Hemoglobin 13.1 13.0 - 17.0 g/dL   HCT 44.0 10.2 - 72.5 %   MCV 85.4 80.0 - 100.0 fL   MCH 28.6 26.0 - 34.0 pg   MCHC 33.5 30.0 - 36.0 g/dL   RDW 36.6 44.0 - 34.7 %   Platelets 256 150 - 400 K/uL   nRBC 0.0 0.0 - 0.2 %   Neutrophils Relative % 44 %   Neutro Abs 2.2 1.7 - 7.7 K/uL   Lymphocytes Relative 39 %   Lymphs Abs 2.0 0.7 - 4.0 K/uL   Monocytes Relative 10 %   Monocytes Absolute 0.5 0.1 - 1.0 K/uL   Eosinophils Relative 6 %   Eosinophils Absolute 0.3 0.0 - 0.5 K/uL   Basophils Relative 1 %   Basophils Absolute 0.0 0.0 - 0.1 K/uL   Immature Granulocytes 0 %   Abs Immature Granulocytes 0.01 0.00 - 0.07 K/uL  Lipase, blood   Collection Time: 09/29/22 11:50 PM  Result Value Ref Range   Lipase 30 11 - 51 U/L      Assessment & Plan:   Problem List Items Addressed This Visit       Other   Schizophrenia (HCC) - Primary   Was seen at [facility] but wasn't able to complete the program due to needing to work and being required to go to the office 4 days a week.  Will place new referral for patient during visit.  He is interested in medication to help with anxiety.  Follow up in 1 month.  Call sooner if concerns arise.      Relevant Orders   Ambulatory referral to Psychiatry     Follow up plan: Return in about 1 month (around 05/22/2023) for Physical and Fasting labs.

## 2023-04-21 NOTE — Assessment & Plan Note (Signed)
Was seen at [facility] but wasn't able to complete the program due to needing to work and being required to go to the office 4 days a week.  Will place new referral for patient during visit.  He is interested in medication to help with anxiety.  Follow up in 1 month.  Call sooner if concerns arise.

## 2023-05-01 ENCOUNTER — Encounter: Payer: MEDICAID | Admitting: Nurse Practitioner

## 2023-05-01 NOTE — Progress Notes (Deleted)
 There were no vitals taken for this visit.   Subjective:    Patient ID: Lucas Guerra, male    DOB: [birth year], 38 y.o.   MRN: [id number]  HPI: Lucas Guerra is a 38 y.o. male presenting on 05/01/2023 for comprehensive medical examination. Current medical complaints include:{Blank single:19197::none,***}  He currently lives with: Interim Problems from his last visit: {Blank single:19197::yes,no}  Depression Screen done today and results listed below:     04/21/2023    1:50 PM 04/02/2022   11:03 AM 02/05/2022   10:25 AM 01/02/2022    9:03 AM 05/05/2017    9:16 AM  Depression screen PHQ 2/9  Decreased Interest 0 0 0 0 3  Down, Depressed, Hopeless 3 0 0 0 3  PHQ - 2 Score 3 0 0 0 6  Altered sleeping 3  3 3 3   Tired, decreased energy 0  0 1 3  Change in appetite 3  3 0 3  Feeling bad or failure about yourself  3  0 3 1  Trouble concentrating 0  0 0 3  Moving slowly or fidgety/restless 0  0 0 0  Suicidal thoughts 0  0 0 0  PHQ-9 Score 12  6 7 19   Difficult doing work/chores   Extremely dIfficult  Very difficult    The patient {has/does not yjcz:80150} a history of falls. I {did/did not:19850} complete a risk assessment for falls. A plan of care for falls {was/was not:19852} documented.   Past Medical History:  Past Medical History:  Diagnosis Date   Anxiety    GERD (gastroesophageal reflux disease)    Heart murmur    Paranoid schizophrenia (HCC) 11/02/2014   Plantar wart of right foot    Schizophrenia [facility])     Surgical History:  Past Surgical History:  Procedure Laterality Date   ESOPHAGOGASTRODUODENOSCOPY     HARDWARE REMOVAL Left 06/04/2016   Procedure: HARDWARE REMOVAL WITH POSSIBLE BONE GRAFTING OF THE DISTAL FIBULAR FRACTURE;  Surgeon: Norleen JINNY Maltos, MD;  Location: [facility] ORS;  Service: Orthopedics;  Laterality: Left;   ORIF ANKLE FRACTURE Right 08/19/15   ORIF ANKLE FRACTURE Left 08/19/2015   Procedure: OPEN REDUCTION INTERNAL FIXATION (ORIF) ANKLE  FRACTURE;  Surgeon: Norleen JINNY Maltos, MD;  Location: [facility] ORS;  Service: Orthopedics;  Laterality: Left;   PLANTAR'S WART EXCISION Right     Medications:  Current Outpatient Medications on File Prior to Visit  Medication Sig   linaclotide  (LINZESS ) 145 MCG CAPS capsule Take 1 capsule (145 mcg total) by mouth daily before breakfast.   No current facility-administered medications on file prior to visit.    Allergies:  Allergies  Allergen Reactions   Iohexol Cough    Patient experienced coughing episode immediately after receiving CT contrast; no wheezing or airway issues   Tramadol  Rash    Social History:  Social History   Socioeconomic History   Marital status: Single    Spouse name: Not on file   Number of children: Not on file   Years of education: Not on file   Highest education level: Not on file  Occupational History   Not on file  Tobacco Use   Smoking status: Every Day    Current packs/day: 1.00    Average packs/day: 1 pack/day for 12.0 years (12.0 ttl pk-yrs)    Types: Cigarettes   Smokeless tobacco: Never  Vaping Use   Vaping status: Never Used  Substance and Sexual Activity   Alcohol  use: No   Drug  use: Yes    Types: Marijuana   Sexual activity: Yes    Partners: Female  Other Topics Concern   Not on file  Social History Narrative   Not on file   Social Drivers of Health   Financial Resource Strain: Not on file  Food Insecurity: Not on file  Transportation Needs: Not on file  Physical Activity: Not on file  Stress: Not on file  Social Connections: Not on file  Intimate Partner Violence: Not on file   Social History   Tobacco Use  Smoking Status Every Day   Current packs/day: 1.00   Average packs/day: 1 pack/day for 12.0 years (12.0 ttl pk-yrs)   Types: Cigarettes  Smokeless Tobacco Never   Social History   Substance and Sexual Activity  Alcohol  Use No    Family History:  Family History  Problem Relation Age of Onset   Hypertension  Mother    Diabetes Father     Past medical history, surgical history, medications, allergies, family history and social history reviewed with patient today and changes made to appropriate areas of the chart.   ROS All other ROS negative except what is listed above and in the HPI.      Objective:    There were no vitals taken for this visit.  Wt Readings from Last 3 Encounters:  04/21/23 136 lb 12.8 oz (62.1 kg)  04/02/22 147 lb (66.7 kg)  02/05/22 145 lb 12.8 oz (66.1 kg)    Physical Exam  Results for orders placed or performed during the hospital encounter of 09/29/22  Comprehensive metabolic panel   Collection Time: 09/29/22 11:50 PM  Result Value Ref Range   Sodium 138 135 - 145 mmol/L   Potassium 3.5 3.5 - 5.1 mmol/L   Chloride 98 98 - 111 mmol/L   CO2 30 22 - 32 mmol/L   Glucose, Bld 87 70 - 99 mg/dL   BUN 15 6 - 20 mg/dL   Creatinine, Ser 8.86 0.61 - 1.24 mg/dL   Calcium 9.3 8.9 - 89.6 mg/dL   Total Protein 7.2 6.5 - 8.1 g/dL   Albumin 4.0 3.5 - 5.0 g/dL   AST 14 (L) 15 - 41 U/L   ALT 11 0 - 44 U/L   Alkaline Phosphatase 85 38 - 126 U/L   Total Bilirubin 0.5 0.3 - 1.2 mg/dL   GFR, Estimated >39 >39 mL/min   Anion gap 10 5 - 15  CBC with Differential/Platelet   Collection Time: 09/29/22 11:50 PM  Result Value Ref Range   WBC 5.0 4.0 - 10.5 K/uL   RBC 4.58 4.22 - 5.81 MIL/uL   Hemoglobin 13.1 13.0 - 17.0 g/dL   HCT 60.8 60.9 - 47.9 %   MCV 85.4 80.0 - 100.0 fL   MCH 28.6 26.0 - 34.0 pg   MCHC 33.5 30.0 - 36.0 g/dL   RDW 87.5 88.4 - 84.4 %   Platelets 256 150 - 400 K/uL   nRBC 0.0 0.0 - 0.2 %   Neutrophils Relative % 44 %   Neutro Abs 2.2 1.7 - 7.7 K/uL   Lymphocytes Relative 39 %   Lymphs Abs 2.0 0.7 - 4.0 K/uL   Monocytes Relative 10 %   Monocytes Absolute 0.5 0.1 - 1.0 K/uL   Eosinophils Relative 6 %   Eosinophils Absolute 0.3 0.0 - 0.5 K/uL   Basophils Relative 1 %   Basophils Absolute 0.0 0.0 - 0.1 K/uL   Immature Granulocytes 0 %   Abs  Immature  Granulocytes 0.01 0.00 - 0.07 K/uL  Lipase, blood   Collection Time: 09/29/22 11:50 PM  Result Value Ref Range   Lipase 30 11 - 51 U/L      Assessment & Plan:   Problem List Items Addressed This Visit   None    Discussed aspirin prophylaxis for myocardial infarction prevention and decision was {Blank single:19197::it was not indicated,made to continue ASA,made to start ASA,made to stop ASA,that we recommended ASA, and patient refused}  LABORATORY TESTING:  Health maintenance labs ordered today as discussed above.   The natural history of prostate cancer and ongoing controversy regarding screening and potential treatment outcomes of prostate cancer has been discussed with the patient. The meaning of a false positive PSA and a false negative PSA has been discussed. He indicates understanding of the limitations of this screening test and wishes *** to proceed with screening PSA testing.   IMMUNIZATIONS:   - Tdap: Tetanus vaccination status reviewed: {tetanus status:315746}. - Influenza: {Blank single:19197::Up to date,Administered today,Postponed to flu season,Refused,Given elsewhere} - Pneumovax: {Blank single:19197::Up to date,Administered today,Not applicable,Refused,Given elsewhere} - Prevnar: {Blank single:19197::Up to date,Administered today,Not applicable,Refused,Given elsewhere} - COVID: {Blank single:19197::Up to date,Administered today,Not applicable,Refused,Given elsewhere} - HPV: {Blank single:19197::Up to date,Administered today,Not applicable,Refused,Given elsewhere} - Shingrix vaccine: {Blank single:19197::Up to date,Administered today,Not applicable,Refused,Given elsewhere}  SCREENING: - Colonoscopy: {Blank single:19197::Up to date,Ordered today,Not applicable,Refused,Done elsewhere}  Discussed with patient purpose of the colonoscopy is to detect colon cancer at curable precancerous or early  stages   - AAA Screening: {Blank single:19197::Up to date,Ordered today,Not applicable,Refused,Done elsewhere}  -Hearing Test: {Blank single:19197::Up to date,Ordered today,Not applicable,Refused,Done elsewhere}  -Spirometry: {Blank single:19197::Up to date,Ordered today,Not applicable,Refused,Done elsewhere}   PATIENT COUNSELING:    Sexuality: Discussed sexually transmitted diseases, partner selection, use of condoms, avoidance of unintended pregnancy  and contraceptive alternatives.   Advised to avoid cigarette smoking.  I discussed with the patient that most people either abstain from alcohol  or drink within safe limits (<=14/week and <=4 drinks/occasion for males, <=7/weeks and <= 3 drinks/occasion for females) and that the risk for alcohol  disorders and other health effects rises proportionally with the number of drinks per week and how often a drinker exceeds daily limits.  Discussed cessation/primary prevention of drug use and availability of treatment for abuse.   Diet: Encouraged to adjust caloric intake to maintain  or achieve ideal body weight, to reduce intake of dietary saturated fat and total fat, to limit sodium intake by avoiding high sodium foods and not adding table salt, and to maintain adequate dietary potassium and calcium preferably from fresh fruits, vegetables, and low-fat dairy products.    stressed the importance of regular exercise  Injury prevention: Discussed safety belts, safety helmets, smoke detector, smoking near bedding or upholstery.   Dental health: Discussed importance of regular tooth brushing, flossing, and dental visits.   Follow up plan: NEXT PREVENTATIVE PHYSICAL DUE IN 1 YEAR. No follow-ups on file.

## 2023-08-07 ENCOUNTER — Ambulatory Visit: Payer: Self-pay

## 2023-08-07 NOTE — Telephone Encounter (Signed)
 Summary: Lightheaded, seeking appt   Copied From CRM ([id number]. Reason for Triage: Pt is experiencing lightheadedness and knows that his balance is off. Debating on staying home from work today. Seeking appt, nothing available until next Thursday.  Best contact: [phone]         Chief Complaint: lightheaded, vomiting, ears ringing, dry lips and mouth, states not able to drink a lot due to vomiting Symptoms: see above Frequency: for a week now Pertinent Negatives: Patient denies fever, cp, sob Disposition: [x] ED /[] Urgent Care (no appt availability in office) / [] Appointment(In office/virtual)/ []  [facility]/ [] Home Care/ [] Refused Recommended Disposition /[] [facility]/ []  Follow-up with PCP Additional Notes: instructed to go to ER.  Pcp office updated.  Reason for Disposition  SEVERE dizziness (e.g., unable to stand, requires support to walk, feels like passing out now)  Answer Assessment - Initial Assessment Questions 1. DESCRIPTION: "Describe your dizziness."     lightheaded 2. LIGHTHEADED: "Do you feel lightheaded?" (e.g., somewhat faint, woozy, weak upon standing)     States lightheaded, ringing in ears 3. VERTIGO: "Do you feel like either you or the room is spinning or tilting?" (i.e. vertigo)     na 4. SEVERITY: "How bad is it?"  "Do you feel like you are going to faint?" "Can you stand and walk?"   - MILD: Feels slightly dizzy, but walking normally.   - MODERATE: Feels unsteady when walking, but not falling; interferes with normal activities (e.g., school, work).   - SEVERE: Unable to walk without falling, or requires assistance to walk without falling; feels like passing out now.      mild 5. ONSET:  "When did the dizziness begin?"     For about a week now 6. AGGRAVATING FACTORS: "Does anything make it worse?" (e.g., standing, change in head position)     no 7. HEART RATE: "Can you tell me your heart rate?" "How many beats in 15 seconds?"  (Note:  not all patients can do this)       na 8. CAUSE: "What do you think is causing the dizziness?"     unknown 9. RECURRENT SYMPTOM: "Have you had dizziness before?" If Yes, ask: "When was the last time?" "What happened that time?"     no 10. OTHER SYMPTOMS: "Do you have any other symptoms?" (e.g., fever, chest pain, vomiting, diarrhea, bleeding)       vomiting 11. PREGNANCY: "Is there any chance you are pregnant?" "When was your last menstrual period?"       na  Protocols used: Dizziness - Lightheadedness-A-AH

## 2023-11-26 ENCOUNTER — Ambulatory Visit: Payer: Self-pay

## 2023-11-26 NOTE — Telephone Encounter (Signed)
 FYI Only or Action Required?: FYI only for provider.  Patient was last seen in primary care on 04/21/2023 by Melvin Pao, NP.  Called Nurse Triage reporting Anxiety and Panic Attack.  Symptoms began several weeks ago.  Interventions attempted: Other: tries to sit and calm down.  Symptoms are: mild anxiety, panic attacks about 3 times daily with SOB, heart racing, sometimes wakes him up with sweating gradually worsening.  Triage Disposition: See Physician Within 24 Hours  Patient/caregiver understands and will follow disposition?: Yes          Copied from CRM #[id number]. Topic: Clinical - Red Word Triage >> Nov 26, 2023 12:58 PM Rosaria BRAVO wrote: Red Word that prompted transfer to Nurse Triage: Mental health/panic attacks   ----------------------------------------------------------------------- From previous Reason for Contact - Referral Status: Reason for CRM: Reason for Disposition  Patient sounds very upset or troubled to the triager  Answer Assessment - Initial Assessment Questions 1. CONCERN: Did anything happen that prompted you to call today?      He states it has been this way for the past 2 weeks, worsening. He states he talked to his people and they told him to call his doctor.  2. ANXIETY SYMPTOMS: Can you describe how you (your loved one; patient) have been feeling? (e.g., tense, restless, panicky, anxious, keyed up, overwhelmed, sense of impending doom).      Real anxious, he states his heart will get to racing and it gets hard to breathe and he states he has to sit somewhere and fit it til it stops. Occurs about 3 times a day.  3. ONSET: How long have you been feeling this way? (e.g., hours, days, weeks)     Worsening the past 2 weeks.  4. SEVERITY: How would you rate the level of anxiety? (e.g., 0 - 10; or mild, moderate, severe).     He states he it is mild right now; he is not having a panic attack at this time.  5. FUNCTIONAL IMPAIRMENT:  How have these feelings affected your ability to do daily activities? Have you had more difficulty than usual doing your normal daily activities? (e.g., getting better, same, worse; self-care, school, work, interactions)     He states he can to an extent but it is difficult to do normal activities. He states sometimes he will wake up in the middle of the night with panic and cold sweats.  6. HISTORY: Have you felt this way before? Have you ever been diagnosed with an anxiety problem in the past? (e.g., generalized anxiety disorder, panic attacks, PTSD). If Yes, ask: How was this problem treated? (e.g., medicines, counseling, etc.)     Paranoid schizophrenia. In the past he has done counseling and was on alprazolam and depakote .  7. RISK OF HARM - SUICIDAL IDEATION: Do you ever have thoughts of hurting or killing yourself? If Yes, ask:  Do you have these feelings now? Do you have a plan on how you would do this?     No.  8. TREATMENT:  What has been done so far to treat this anxiety? (e.g., medicines, relaxation strategies). What has helped?    He states at home he is treating it by just trying to sit down and wait til it passes.  9. THERAPIST: Do you have a counselor or therapist? If Yes, ask: What is their name?     He states he was seeing [place] and tried to get in recently but they said he is no longer established  because he missed an appt.  10. POTENTIAL TRIGGERS: Do you drink caffeinated beverages (e.g., coffee, colas, teas), and how much daily? Do you drink alcohol  or use any drugs? Have you started any new medicines recently?       Recent loss of his loved one, his aunt. He states he used to drink a lot of tea but not anymore. No alcohol  or drugs.  11. PATIENT SUPPORT: Who is with you now? Who do you live with? Do you have family or friends who you can talk to?        He states he lost his aunt who was a big support and like a mom to him. He  states he can reach out to his sister for support too.  12. OTHER SYMPTOMS: Do you have any other symptoms? (e.g., feeling depressed, trouble concentrating, trouble sleeping, trouble breathing, palpitations or fast heartbeat, chest pain, sweating, nausea, or diarrhea)       Heart racing, SOB, trouble sleeping and wake up in a sweat if panic attack occurs. None of the symptoms present at this time.  13. PREGNANCY: Is there any chance you are pregnant? When was your last menstrual period?       N/A.  Protocols used: Anxiety and Panic Attack-A-AH

## 2023-11-27 ENCOUNTER — Encounter: Payer: Self-pay | Admitting: Nurse Practitioner

## 2023-11-27 ENCOUNTER — Ambulatory Visit (INDEPENDENT_AMBULATORY_CARE_PROVIDER_SITE_OTHER): Payer: MEDICAID | Admitting: Nurse Practitioner

## 2023-11-27 VITALS — BP 103/66 | HR 90 | Temp 98.3°F | Ht 66.0 in | Wt 131.8 lb

## 2023-11-27 DIAGNOSIS — F200 Paranoid schizophrenia: Secondary | ICD-10-CM | POA: Diagnosis not present

## 2023-11-27 MED ORDER — DIVALPROEX SODIUM ER 250 MG PO TB24: 250.0000 mg | ORAL_TABLET | Freq: Every day | ORAL | 0 refills | Status: AC

## 2023-11-27 NOTE — Progress Notes (Signed)
 BP 103/66   Pulse 90   Temp 98.3 F (36.8 C) (Oral)   Ht 5' 6 (1.676 m)   Wt 131 lb 12.8 oz (59.8 kg)   SpO2 98%   BMI 21.27 kg/m    Subjective:    Patient ID: Lucas Guerra, male    DOB: May 20, 1985, 38 y.o.   MRN: [id number]  HPI: Lucas Guerra is a 38 y.o. male  Chief Complaint  Patient presents with   Anxiety    Patient states he has been having an increase in anxiety since his aunt passed about 3 weeks ago. States he was very close to her and has had a hard time since her passing as she was his go to person.    MOOD Patient states over the last three weeks he has not been doing well.  His mom aunt passed and that has sent him into a spiral.  He is waking up with panic attacks and having them a couple of times a day.  He was really close with his aunt.  He went to [facility] and didn't have a great experience.  Patient states he was on Depakote  in the past and that helped him eats more.  He states hydroxyzine  made him itch.    Flowsheet Row Office Visit from 11/27/2023 in [facility]  PHQ-9 Total Score 6      11/27/2023    9:21 AM 04/21/2023    1:51 PM 02/05/2022   10:25 AM 01/02/2022    9:04 AM  GAD 7 : Generalized Anxiety Score  Nervous, Anxious, on Edge 3 3 2 3   Control/stop worrying 0 3 0 1  Worry too much - different things 0 3 0 1  Trouble relaxing 3 3 3 3   Restless 1 2 1 1   Easily annoyed or irritable 0 2 1 3   Afraid - awful might happen 0 0 0 0  Total GAD 7 Score 7 16 7 12   Anxiety Difficulty Extremely difficult  Very difficult       Relevant past medical, surgical, family and social history reviewed and updated as indicated. Interim medical history since our last visit reviewed. Allergies and medications reviewed and updated.  Review of Systems  Psychiatric/Behavioral:  Positive for dysphoric mood and sleep disturbance. Negative for suicidal ideas. The patient is nervous/anxious.     Per HPI unless specifically indicated  above     Objective:    BP 103/66   Pulse 90   Temp 98.3 F (36.8 C) (Oral)   Ht 5' 6 (1.676 m)   Wt 131 lb 12.8 oz (59.8 kg)   SpO2 98%   BMI 21.27 kg/m   Wt Readings from Last 3 Encounters:  11/27/23 131 lb 12.8 oz (59.8 kg)  04/21/23 136 lb 12.8 oz (62.1 kg)  04/02/22 147 lb (66.7 kg)    Physical Exam Vitals and nursing note reviewed.  Constitutional:      General: He is not in acute distress.    Appearance: Normal appearance. He is not ill-appearing, toxic-appearing or diaphoretic.  HENT:     Head: Normocephalic.     Right Ear: External ear normal.     Left Ear: External ear normal.     Nose: Nose normal. No congestion or rhinorrhea.     Mouth/Throat:     Mouth: Mucous membranes are moist.  Eyes:     General:        Right eye: No discharge.  Left eye: No discharge.     Extraocular Movements: Extraocular movements intact.     Conjunctiva/sclera: Conjunctivae normal.     Pupils: Pupils are equal, round, and reactive to light.  Cardiovascular:     Rate and Rhythm: Normal rate and regular rhythm.     Heart sounds: No murmur heard. Pulmonary:     Effort: Pulmonary effort is normal. No respiratory distress.     Breath sounds: Normal breath sounds. No wheezing, rhonchi or rales.  Abdominal:     General: Abdomen is flat. Bowel sounds are normal.  Musculoskeletal:     Cervical back: Normal range of motion and neck supple.  Skin:    General: Skin is warm and dry.     Capillary Refill: Capillary refill takes less than 2 seconds.  Neurological:     General: No focal deficit present.     Mental Status: He is alert and oriented to person, place, and time.  Psychiatric:        Mood and Affect: Mood normal.        Behavior: Behavior normal.        Thought Content: Thought content normal.        Judgment: Judgment normal.     Results for orders placed or performed during the hospital encounter of 09/29/22  Comprehensive metabolic panel   Collection Time:  09/29/22 11:50 PM  Result Value Ref Range   Sodium 138 135 - 145 mmol/L   Potassium 3.5 3.5 - 5.1 mmol/L   Chloride 98 98 - 111 mmol/L   CO2 30 22 - 32 mmol/L   Glucose, Bld 87 70 - 99 mg/dL   BUN 15 6 - 20 mg/dL   Creatinine, Ser 8.86 0.61 - 1.24 mg/dL   Calcium 9.3 8.9 - 89.6 mg/dL   Total Protein 7.2 6.5 - 8.1 g/dL   Albumin 4.0 3.5 - 5.0 g/dL   AST 14 (L) 15 - 41 U/L   ALT 11 0 - 44 U/L   Alkaline Phosphatase 85 38 - 126 U/L   Total Bilirubin 0.5 0.3 - 1.2 mg/dL   GFR, Estimated >39 >39 mL/min   Anion gap 10 5 - 15  CBC with Differential/Platelet   Collection Time: 09/29/22 11:50 PM  Result Value Ref Range   WBC 5.0 4.0 - 10.5 K/uL   RBC 4.58 4.22 - 5.81 MIL/uL   Hemoglobin 13.1 13.0 - 17.0 g/dL   HCT 60.8 60.9 - 47.9 %   MCV 85.4 80.0 - 100.0 fL   MCH 28.6 26.0 - 34.0 pg   MCHC 33.5 30.0 - 36.0 g/dL   RDW 87.5 88.4 - 84.4 %   Platelets 256 150 - 400 K/uL   nRBC 0.0 0.0 - 0.2 %   Neutrophils Relative % 44 %   Neutro Abs 2.2 1.7 - 7.7 K/uL   Lymphocytes Relative 39 %   Lymphs Abs 2.0 0.7 - 4.0 K/uL   Monocytes Relative 10 %   Monocytes Absolute 0.5 0.1 - 1.0 K/uL   Eosinophils Relative 6 %   Eosinophils Absolute 0.3 0.0 - 0.5 K/uL   Basophils Relative 1 %   Basophils Absolute 0.0 0.0 - 0.1 K/uL   Immature Granulocytes 0 %   Abs Immature Granulocytes 0.01 0.00 - 0.07 K/uL  Lipase, blood   Collection Time: 09/29/22 11:50 PM  Result Value Ref Range   Lipase 30 11 - 51 U/L      Assessment & Plan:   Problem List Items Addressed  This Visit       Other   Schizophrenia (HCC) - Primary   Chronic. Not well controlled.  Not currently seeing psychiatry.  New referral placed today.  Will start Depakote  which patient has been on in the past.  Side effects and benefits discussed.  Follow up in 1 month.  Will check Depakote  level at that time.  Information given to [facility] to patient to make an appointment.       Relevant Medications   divalproex  (DEPAKOTE  ER)  250 MG 24 hr tablet   Other Relevant Orders   Ambulatory referral to Psychiatry      Follow up plan: Return in about 1 month (around 12/27/2023) for Depression/Anxiety FU.

## 2023-11-27 NOTE — Assessment & Plan Note (Signed)
 Chronic. Not well controlled.  Not currently seeing psychiatry.  New referral placed today.  Will start Depakote  which patient has been on in the past.  Side effects and benefits discussed.  Follow up in 1 month.  Will check Depakote  level at that time.  Information given to [facility] to patient to make an appointment.

## 2023-11-27 NOTE — Patient Instructions (Signed)
(  [phone] [id number]- [facility]

## 2023-12-18 ENCOUNTER — Telehealth: Payer: Self-pay

## 2023-12-18 DIAGNOSIS — R1110 Vomiting, unspecified: Secondary | ICD-10-CM

## 2023-12-18 NOTE — Telephone Encounter (Unsigned)
 Copied from CRM [id number]. Topic: Referral - Request for Referral >> Dec 18, 2023  4:24 PM Myrick T wrote: Did the patient discuss referral with their provider in the last year? No  Appointment offered? No, already has an appt  Type of order/referral and detailed reason for visit: Gastroenterologist  Preference of office, provider, location: unknown  If referral order, have you been seen by this specialty before? No (If Yes, this issue or another issue? When? Where?  Can we respond through MyChart? Yes

## 2023-12-19 NOTE — Telephone Encounter (Signed)
 Referral placed for GI

## 2023-12-19 NOTE — Telephone Encounter (Signed)
 Called and spoke to patient. He states he would like to be seen with [facility] because he has been seen several times for GI issues and nothing has been found in his upper GI. Patient would like to see them to have lower GI checked since he keeps giving vomiting and GI issues.

## 2023-12-29 ENCOUNTER — Ambulatory Visit: Payer: MEDICAID | Admitting: Nurse Practitioner

## 2023-12-29 NOTE — Progress Notes (Deleted)
 There were no vitals taken for this visit.   Subjective:    Patient ID: Lucas Guerra, male    DOB: 1985/11/21, 38 y.o.   MRN: [id number]  HPI: Lucas Guerra is a 39 y.o. male  No chief complaint on file.  MOOD Patient states over the last three weeks he has not been doing well.  His mom aunt passed and that has sent him into a spiral.  He is waking up with panic attacks and having them a couple of times a day.  He was really close with his aunt.  He went to [facility] and didn't have a great experience.  Patient states he was on Depakote  in the past and that helped him eats more.  He states hydroxyzine  made him itch.    Flowsheet Row Office Visit from 11/27/2023 in [facility]  PHQ-9 Total Score 6      11/27/2023    9:21 AM 04/21/2023    1:51 PM 02/05/2022   10:25 AM 01/02/2022    9:04 AM  GAD 7 : Generalized Anxiety Score  Nervous, Anxious, on Edge 3 3 2 3   Control/stop worrying 0 3 0 1  Worry too much - different things 0 3 0 1  Trouble relaxing 3 3 3 3   Restless 1 2 1 1   Easily annoyed or irritable 0 2 1 3   Afraid - awful might happen 0 0 0 0  Total GAD 7 Score 7 16 7 12   Anxiety Difficulty Extremely difficult  Very difficult       Relevant past medical, surgical, family and social history reviewed and updated as indicated. Interim medical history since our last visit reviewed. Allergies and medications reviewed and updated.  Review of Systems  Psychiatric/Behavioral:  Positive for dysphoric mood and sleep disturbance. Negative for suicidal ideas. The patient is nervous/anxious.     Per HPI unless specifically indicated above     Objective:    There were no vitals taken for this visit.  Wt Readings from Last 3 Encounters:  11/27/23 131 lb 12.8 oz (59.8 kg)  04/21/23 136 lb 12.8 oz (62.1 kg)  04/02/22 147 lb (66.7 kg)    Physical Exam Vitals and nursing note reviewed.  Constitutional:      General: He is not in acute distress.     Appearance: Normal appearance. He is not ill-appearing, toxic-appearing or diaphoretic.  HENT:     Head: Normocephalic.     Right Ear: External ear normal.     Left Ear: External ear normal.     Nose: Nose normal. No congestion or rhinorrhea.     Mouth/Throat:     Mouth: Mucous membranes are moist.  Eyes:     General:        Right eye: No discharge.        Left eye: No discharge.     Extraocular Movements: Extraocular movements intact.     Conjunctiva/sclera: Conjunctivae normal.     Pupils: Pupils are equal, round, and reactive to light.  Cardiovascular:     Rate and Rhythm: Normal rate and regular rhythm.     Heart sounds: No murmur heard. Pulmonary:     Effort: Pulmonary effort is normal. No respiratory distress.     Breath sounds: Normal breath sounds. No wheezing, rhonchi or rales.  Abdominal:     General: Abdomen is flat. Bowel sounds are normal.  Musculoskeletal:     Cervical back: Normal range of motion and  neck supple.  Skin:    General: Skin is warm and dry.     Capillary Refill: Capillary refill takes less than 2 seconds.  Neurological:     General: No focal deficit present.     Mental Status: He is alert and oriented to person, place, and time.  Psychiatric:        Mood and Affect: Mood normal.        Behavior: Behavior normal.        Thought Content: Thought content normal.        Judgment: Judgment normal.     Results for orders placed or performed during the hospital encounter of 09/29/22  Comprehensive metabolic panel   Collection Time: 09/29/22 11:50 PM  Result Value Ref Range   Sodium 138 135 - 145 mmol/L   Potassium 3.5 3.5 - 5.1 mmol/L   Chloride 98 98 - 111 mmol/L   CO2 30 22 - 32 mmol/L   Glucose, Bld 87 70 - 99 mg/dL   BUN 15 6 - 20 mg/dL   Creatinine, Ser 8.86 0.61 - 1.24 mg/dL   Calcium 9.3 8.9 - 89.6 mg/dL   Total Protein 7.2 6.5 - 8.1 g/dL   Albumin 4.0 3.5 - 5.0 g/dL   AST 14 (L) 15 - 41 U/L   ALT 11 0 - 44 U/L   Alkaline Phosphatase  85 38 - 126 U/L   Total Bilirubin 0.5 0.3 - 1.2 mg/dL   GFR, Estimated >39 >39 mL/min   Anion gap 10 5 - 15  CBC with Differential/Platelet   Collection Time: 09/29/22 11:50 PM  Result Value Ref Range   WBC 5.0 4.0 - 10.5 K/uL   RBC 4.58 4.22 - 5.81 MIL/uL   Hemoglobin 13.1 13.0 - 17.0 g/dL   HCT 60.8 60.9 - 47.9 %   MCV 85.4 80.0 - 100.0 fL   MCH 28.6 26.0 - 34.0 pg   MCHC 33.5 30.0 - 36.0 g/dL   RDW 87.5 88.4 - 84.4 %   Platelets 256 150 - 400 K/uL   nRBC 0.0 0.0 - 0.2 %   Neutrophils Relative % 44 %   Neutro Abs 2.2 1.7 - 7.7 K/uL   Lymphocytes Relative 39 %   Lymphs Abs 2.0 0.7 - 4.0 K/uL   Monocytes Relative 10 %   Monocytes Absolute 0.5 0.1 - 1.0 K/uL   Eosinophils Relative 6 %   Eosinophils Absolute 0.3 0.0 - 0.5 K/uL   Basophils Relative 1 %   Basophils Absolute 0.0 0.0 - 0.1 K/uL   Immature Granulocytes 0 %   Abs Immature Granulocytes 0.01 0.00 - 0.07 K/uL  Lipase, blood   Collection Time: 09/29/22 11:50 PM  Result Value Ref Range   Lipase 30 11 - 51 U/L      Assessment & Plan:   Problem List Items Addressed This Visit   None      Follow up plan: No follow-ups on file.

## 2023-12-30 ENCOUNTER — Encounter: Payer: Self-pay | Admitting: Psychiatry

## 2023-12-30 ENCOUNTER — Other Ambulatory Visit: Payer: Self-pay

## 2023-12-30 ENCOUNTER — Ambulatory Visit (INDEPENDENT_AMBULATORY_CARE_PROVIDER_SITE_OTHER): Payer: MEDICAID | Admitting: Psychiatry

## 2023-12-30 DIAGNOSIS — F410 Panic disorder [episodic paroxysmal anxiety] without agoraphobia: Secondary | ICD-10-CM | POA: Diagnosis not present

## 2023-12-30 DIAGNOSIS — F4321 Adjustment disorder with depressed mood: Secondary | ICD-10-CM

## 2023-12-30 DIAGNOSIS — F5105 Insomnia due to other mental disorder: Secondary | ICD-10-CM

## 2023-12-30 DIAGNOSIS — F332 Major depressive disorder, recurrent severe without psychotic features: Secondary | ICD-10-CM

## 2023-12-30 DIAGNOSIS — F99 Mental disorder, not otherwise specified: Secondary | ICD-10-CM

## 2023-12-30 MED ORDER — HYDROXYZINE HCL 10 MG PO TABS: ORAL_TABLET | ORAL | 0 refills | Status: AC

## 2023-12-30 MED ORDER — MIRTAZAPINE 15 MG PO TABS: 15.0000 mg | ORAL_TABLET | Freq: Every day | ORAL | 0 refills | Status: AC

## 2023-12-30 NOTE — Progress Notes (Signed)
 Psychiatric Initial Adult Assessment   Patient Identification: Lucas Guerra MRN:  [id number] Date of Evaluation:  12/30/2023 Referral Source: Caprice Pao, NP Chief Complaint:   Chief Complaint  Patient presents with   Establish Care   Visit Diagnosis:    ICD-10-CM   1. Severe episode of recurrent major depressive disorder, without psychotic features (HCC)  F33.2     2. Panic attacks  F41.0     3. Grieving  F43.21     4. Insomnia due to other mental disorder  F51.05    F99       History of Present Illness: 37 year old male presenting [facility] for new patient visit.  Patient reports that he is coming today due to a recent loss in his family in which he lost a aunt that was like a mother to him 2 weeks ago.  Patient reports significant amount of depression and anxiety regarding the situation with panic attacks.  Patient reports he is unable to go to work and works in [employer] and has not been in work since his loss of his aunt.  Patient reports that he previously was admitted to the psychiatric facility but this was not since he was 55 when he was using marijuana and was admitted for substance-induced psychosis.  Patient reports ever since stopping marijuana he has not had any psychiatric issues since and states that he has been placed recently on Depakote  for no clear cause or reason from the provider.  Patient reports currently that he is having extreme depression with hopelessness anhedonia, sadness, grief, anxiety, panic attacks.  Patient also reports he is not currently with a counselor and states he would be interested in getting a grief counselor.  Based on this assessment interview is recommended for the patient to stop Depakote , and to continue start mirtazapine 15 mg once daily and hydroxyzine  10 to 20 mg 3 times a day as needed for anxiety.  Patient has also been provided a referral to therapist for grief counseling at [facility] counseling with Damien Groves.  Patient with no other  questions or concerns at this time.  Patient is in agreement with treatment plan.  Patient to follow-up in 2 weeks.  Associated Signs/Symptoms: Depression Symptoms:  anhedonia, fatigue, feelings of worthlessness/guilt, difficulty concentrating, hopelessness, anxiety, panic attacks, disturbed sleep, weight loss, (Hypo) Manic Symptoms:  Negative Anxiety Symptoms:  Excessive Worry, Panic Symptoms, Psychotic Symptoms:  Negative PTSD Symptoms: Negative  Past Psychiatric History:  Previous Psych Hospitalizations: - Last hospitalization at [facility] at 85, for substance induced psychosis Outpatient treatment:  - Pao Petty NP Medications Current: - Mirtazepine 15mg  once daily - Hydroxyzine  10-20mg  TID for anxiety Next Steps: - Monitor symptoms, attach therapy Medication Trials: - None recalled Suicide & Violence: -Denies Substance Use: - Chronic marijuana use at 18, quit since.  Psychotherapy: - Denies Legal:  - Denies Previous Psychotropic Medications: Yes   Substance Abuse History in the last 12 months:  No.  Consequences of Substance Abuse: Negative  Past Medical History:  Past Medical History:  Diagnosis Date   Anxiety    GERD (gastroesophageal reflux disease)    Heart murmur    Paranoid schizophrenia (HCC) 11/02/2014   Plantar wart of right foot    Schizophrenia (HCC)     Past Surgical History:  Procedure Laterality Date   ESOPHAGOGASTRODUODENOSCOPY     HARDWARE REMOVAL Left 06/04/2016   Procedure: HARDWARE REMOVAL WITH POSSIBLE BONE GRAFTING OF THE DISTAL FIBULAR FRACTURE;  Surgeon: Norleen JINNY Maltos, MD;  Location: [facility]  ORS;  Service: Orthopedics;  Laterality: Left;   ORIF ANKLE FRACTURE Right 08/19/15   ORIF ANKLE FRACTURE Left 08/19/2015   Procedure: OPEN REDUCTION INTERNAL FIXATION (ORIF) ANKLE FRACTURE;  Surgeon: Norleen JINNY Maltos, MD;  Location: [facility] ORS;  Service: Orthopedics;  Laterality: Left;   PLANTAR'S WART EXCISION Right     Family Psychiatric  History: No additional  Family History:  Family History  Problem Relation Age of Onset   Hypertension Mother    Diabetes Father     Social History:   Social History   Socioeconomic History   Marital status: Single    Spouse name: Not on file   Number of children: 1   Years of education: Not on file   Highest education level: 10th grade  Occupational History   Not on file  Tobacco Use   Smoking status: Former    Current packs/day: 1.00    Average packs/day: 1 pack/day for 12.0 years (12.0 ttl pk-yrs)    Types: Cigarettes   Smokeless tobacco: Never  Vaping Use   Vaping status: Every Day  Substance and Sexual Activity   Alcohol  use: Yes    Comment: occ   Drug use: Not Currently    Types: Marijuana    Comment: has not smoke in 20 years   Sexual activity: Yes    Partners: Female    Birth control/protection: Condom  Other Topics Concern   Not on file  Social History Narrative   Not on file   Social Drivers of Health   Financial Resource Strain: Not on file  Food Insecurity: Not on file  Transportation Needs: Not on file  Physical Activity: Not on file  Stress: Not on file  Social Connections: Not on file    Additional Social History: No additional  Allergies:   Allergies  Allergen Reactions   Iohexol Cough    Patient experienced coughing episode immediately after receiving CT contrast; no wheezing or airway issues   Tramadol  Rash    Metabolic Disorder Labs: No results found for: HGBA1C, MPG No results found for: PROLACTIN Lab Results  Component Value Date   CHOL 194 02/05/2022   TRIG 56 02/05/2022   HDL 66 02/05/2022   CHOLHDL 2.9 02/05/2022   VLDL 22 04/02/2019   LDLCALC 118 (H) 02/05/2022   LDLCALC 91 04/02/2019   Lab Results  Component Value Date   TSH 0.519 02/05/2022    Therapeutic Level Labs: No results found for: LITHIUM No results found for: CBMZ No results found for: VALPROATE  Current Medications: Current Outpatient  Medications  Medication Sig Dispense Refill   divalproex  (DEPAKOTE  ER) 250 MG 24 hr tablet Take 1 tablet (250 mg total) by mouth daily. 90 tablet 0   No current facility-administered medications for this visit.    Musculoskeletal: Strength & Muscle Tone: within normal limits Gait & Station: normal Patient leans: N/A  Psychiatric Specialty Exam: Review of Systems  Constitutional: Negative.   HENT: Negative.    Eyes: Negative.   Respiratory: Negative.    Cardiovascular: Negative.   Gastrointestinal: Negative.   Endocrine: Negative.   Genitourinary: Negative.   Musculoskeletal: Negative.   Skin: Negative.   Allergic/Immunologic: Negative.   Neurological: Negative.   Hematological: Negative.   Psychiatric/Behavioral:  Positive for dysphoric mood. The patient is nervous/anxious.     There were no vitals taken for this visit.There is no height or weight on file to calculate BMI.  General Appearance: Well Groomed  Eye Contact:  Good  Speech:  Clear and Coherent  Volume:  Normal  Mood:  Anxious and Depressed  Affect:  Appropriate  Thought Process:  Coherent  Orientation:  Full (Time, Place, and Person)  Thought Content:  Logical  Suicidal Thoughts:  No  Homicidal Thoughts:  No  Memory:  Immediate;   Good Recent;   Good Remote;   Good  Judgement:  Good  Insight:  Good  Psychomotor Activity:  Normal  Concentration:  Concentration: Good and Attention Span: Good  Recall:  Good  Fund of Knowledge:Good  Language: Good  Akathisia:  No  Handed:  Right  AIMS (if indicated):  not done  Assets:  Desire for Improvement Financial Resources/Insurance Housing  ADL's:  Intact  Cognition: WNL  Sleep:  Good   Screenings: AUDIT    Flowsheet Row Admission (Discharged) from 04/01/2019 in [facility] INPATIENT BEHAVIORAL MEDICINE  Alcohol  Use Disorder Identification Test Final Score (AUDIT) 0   GAD-7    Flowsheet Row Office Visit from 11/27/2023 in [facility]  Office Visit from 04/21/2023 in [facility] Office Visit from 02/05/2022 in [facility] Office Visit from 01/02/2022 in [facility]  Total GAD-7 Score 7 16 7 12    PHQ2-9    Flowsheet Row Office Visit from 11/27/2023 in [facility] Office Visit from 04/21/2023 in [facility] Office Visit from 04/02/2022 in [facility] Office Visit from 02/05/2022 in [facility] Office Visit from 01/02/2022 in [facility]  PHQ-2 Total Score 0 3 0 0 0  PHQ-9 Total Score 6 12 -- 6 7   Flowsheet Row ED from 09/29/2022 in [facility] Emergency Department at [facility] ED from 12/26/2021 in [facility] Emergency Department at [facility] Admission (Discharged) from 04/01/2019 in [facility] INPATIENT BEHAVIORAL MEDICINE  C-SSRS RISK CATEGORY No Risk No Risk No Risk    Assessment and Plan:  Assessment - Diagnosis: Severe episode of recurrent major depressive disorder, without psychotic features (HCC) [F33.2]  2. Panic attacks [F41.0]  3. Grieving [F43.21]  4. Insomnia due to other mental disorder [F51.05, F99]   - Risk Factors: Worsening symptoms, suicide risk  Plan - Medications:  Stop Depakote  250mg  Start Mirtazepine 15mg  once daily for sleep and appetite Start Hydroxyzine  10-20mg  TID as needed for anxiety. - Psychotherapy: Referred to Grief Counselor  - Education: Has been educated on how to reach this provider by calling or utilizing [employer].  Patient educated about medications, purpose, side effects, adverse reactions, and dosage. - Follow-Up: Patient to follow-up in 2 weeks. - Referrals: Therapy referral provided - Safety Planning:  The patient has been educated, if they should have suicidal thoughts with or without a plan to call 911, or go to the closest emergency department.  Pt verbalized  understanding.  Pt denies firearms within the home.  Pt also agrees to call the clinic should they have worsening symptoms before the next appointment.    Patient/Guardian was advised Release of Information must be obtained prior to any record release in order to collaborate their care with an outside provider. Patient/Guardian was advised if they have not already done so to contact the registration department to sign all necessary forms in order for us  to release information regarding their care.   Consent: Patient/Guardian gives verbal consent for treatment and assignment of benefits for services provided during this visit. Patient/Guardian expressed understanding and agreed to proceed.  Dorn Jama Der, NP 10/7/20258:19 AM

## 2024-01-12 ENCOUNTER — Telehealth: Payer: MEDICAID | Admitting: Psychiatry

## 2024-01-29 ENCOUNTER — Telehealth: Payer: MEDICAID | Admitting: Psychiatry

## 2024-02-09 ENCOUNTER — Ambulatory Visit: Payer: MEDICAID | Admitting: Nurse Practitioner

## 2024-02-09 NOTE — Progress Notes (Deleted)
 There were no vitals taken for this visit.   Subjective:    Patient ID: Lucas Guerra, male    DOB: 1985/11/21, 38 y.o.   MRN: [id number]  HPI: Lucas Guerra is a 38 y.o. male  No chief complaint on file.  MOOD Patient states over the last three weeks he has not been doing well.  His mom aunt passed and that has sent him into a spiral.  He is waking up with panic attacks and having them a couple of times a day.  He was really close with his aunt.  He went to [facility] and didn't have a great experience.  Patient states he was on Depakote  in the past and that helped him eats more.  He states hydroxyzine  made him itch.    Flowsheet Row Office Visit from 11/27/2023 in [facility]  PHQ-9 Total Score 6      11/27/2023    9:21 AM 04/21/2023    1:51 PM 02/05/2022   10:25 AM 01/02/2022    9:04 AM  GAD 7 : Generalized Anxiety Score  Nervous, Anxious, on Edge 3 3 2 3   Control/stop worrying 0 3 0 1  Worry too much - different things 0 3 0 1  Trouble relaxing 3 3 3 3   Restless 1 2 1 1   Easily annoyed or irritable 0 2 1 3   Afraid - awful might happen 0 0 0 0  Total GAD 7 Score 7 16 7 12   Anxiety Difficulty Extremely difficult  Very difficult       Relevant past medical, surgical, family and social history reviewed and updated as indicated. Interim medical history since our last visit reviewed. Allergies and medications reviewed and updated.  Review of Systems  Psychiatric/Behavioral:  Positive for dysphoric mood and sleep disturbance. Negative for suicidal ideas. The patient is nervous/anxious.     Per HPI unless specifically indicated above     Objective:    There were no vitals taken for this visit.  Wt Readings from Last 3 Encounters:  11/27/23 131 lb 12.8 oz (59.8 kg)  04/21/23 136 lb 12.8 oz (62.1 kg)  04/02/22 147 lb (66.7 kg)    Physical Exam Vitals and nursing note reviewed.  Constitutional:      General: He is not in acute distress.     Appearance: Normal appearance. He is not ill-appearing, toxic-appearing or diaphoretic.  HENT:     Head: Normocephalic.     Right Ear: External ear normal.     Left Ear: External ear normal.     Nose: Nose normal. No congestion or rhinorrhea.     Mouth/Throat:     Mouth: Mucous membranes are moist.  Eyes:     General:        Right eye: No discharge.        Left eye: No discharge.     Extraocular Movements: Extraocular movements intact.     Conjunctiva/sclera: Conjunctivae normal.     Pupils: Pupils are equal, round, and reactive to light.  Cardiovascular:     Rate and Rhythm: Normal rate and regular rhythm.     Heart sounds: No murmur heard. Pulmonary:     Effort: Pulmonary effort is normal. No respiratory distress.     Breath sounds: Normal breath sounds. No wheezing, rhonchi or rales.  Abdominal:     General: Abdomen is flat. Bowel sounds are normal.  Musculoskeletal:     Cervical back: Normal range of motion and  neck supple.  Skin:    General: Skin is warm and dry.     Capillary Refill: Capillary refill takes less than 2 seconds.  Neurological:     General: No focal deficit present.     Mental Status: He is alert and oriented to person, place, and time.  Psychiatric:        Mood and Affect: Mood normal.        Behavior: Behavior normal.        Thought Content: Thought content normal.        Judgment: Judgment normal.     Results for orders placed or performed during the hospital encounter of 09/29/22  Comprehensive metabolic panel   Collection Time: 09/29/22 11:50 PM  Result Value Ref Range   Sodium 138 135 - 145 mmol/L   Potassium 3.5 3.5 - 5.1 mmol/L   Chloride 98 98 - 111 mmol/L   CO2 30 22 - 32 mmol/L   Glucose, Bld 87 70 - 99 mg/dL   BUN 15 6 - 20 mg/dL   Creatinine, Ser 8.86 0.61 - 1.24 mg/dL   Calcium 9.3 8.9 - 89.6 mg/dL   Total Protein 7.2 6.5 - 8.1 g/dL   Albumin 4.0 3.5 - 5.0 g/dL   AST 14 (L) 15 - 41 U/L   ALT 11 0 - 44 U/L   Alkaline Phosphatase  85 38 - 126 U/L   Total Bilirubin 0.5 0.3 - 1.2 mg/dL   GFR, Estimated >39 >39 mL/min   Anion gap 10 5 - 15  CBC with Differential/Platelet   Collection Time: 09/29/22 11:50 PM  Result Value Ref Range   WBC 5.0 4.0 - 10.5 K/uL   RBC 4.58 4.22 - 5.81 MIL/uL   Hemoglobin 13.1 13.0 - 17.0 g/dL   HCT 60.8 60.9 - 47.9 %   MCV 85.4 80.0 - 100.0 fL   MCH 28.6 26.0 - 34.0 pg   MCHC 33.5 30.0 - 36.0 g/dL   RDW 87.5 88.4 - 84.4 %   Platelets 256 150 - 400 K/uL   nRBC 0.0 0.0 - 0.2 %   Neutrophils Relative % 44 %   Neutro Abs 2.2 1.7 - 7.7 K/uL   Lymphocytes Relative 39 %   Lymphs Abs 2.0 0.7 - 4.0 K/uL   Monocytes Relative 10 %   Monocytes Absolute 0.5 0.1 - 1.0 K/uL   Eosinophils Relative 6 %   Eosinophils Absolute 0.3 0.0 - 0.5 K/uL   Basophils Relative 1 %   Basophils Absolute 0.0 0.0 - 0.1 K/uL   Immature Granulocytes 0 %   Abs Immature Granulocytes 0.01 0.00 - 0.07 K/uL  Lipase, blood   Collection Time: 09/29/22 11:50 PM  Result Value Ref Range   Lipase 30 11 - 51 U/L      Assessment & Plan:   Problem List Items Addressed This Visit   None      Follow up plan: No follow-ups on file.

## 2024-03-16 ENCOUNTER — Ambulatory Visit: Payer: MEDICAID

## 2024-03-16 DIAGNOSIS — Z538 Procedure and treatment not carried out for other reasons: Secondary | ICD-10-CM | POA: Diagnosis not present

## 2024-03-16 DIAGNOSIS — R634 Abnormal weight loss: Secondary | ICD-10-CM | POA: Diagnosis not present

## 2024-03-16 DIAGNOSIS — R1110 Vomiting, unspecified: Secondary | ICD-10-CM | POA: Diagnosis not present

## 2024-03-16 DIAGNOSIS — R1013 Epigastric pain: Secondary | ICD-10-CM | POA: Diagnosis present

## 2024-03-16 DIAGNOSIS — K5909 Other constipation: Secondary | ICD-10-CM | POA: Diagnosis not present

## 2024-03-16 DIAGNOSIS — K2960 Other gastritis without bleeding: Secondary | ICD-10-CM | POA: Diagnosis not present

## 2024-04-08 ENCOUNTER — Ambulatory Visit: Payer: MEDICAID

## 2024-04-08 DIAGNOSIS — R634 Abnormal weight loss: Secondary | ICD-10-CM | POA: Diagnosis not present

## 2024-04-08 DIAGNOSIS — G8929 Other chronic pain: Secondary | ICD-10-CM | POA: Diagnosis not present

## 2024-04-08 DIAGNOSIS — R194 Change in bowel habit: Secondary | ICD-10-CM | POA: Diagnosis not present

## 2024-04-08 DIAGNOSIS — R1013 Epigastric pain: Secondary | ICD-10-CM | POA: Diagnosis not present
